# Patient Record
Sex: Female | Born: 1980
Health system: Southern US, Community
[De-identification: ages and names within clinical notes are randomized; demographics above are authoritative.]

## PROBLEM LIST (undated history)

## (undated) DIAGNOSIS — J338 Other polyp of sinus: Secondary | ICD-10-CM

## (undated) DIAGNOSIS — J343 Hypertrophy of nasal turbinates: Secondary | ICD-10-CM

## (undated) DIAGNOSIS — I219 Acute myocardial infarction, unspecified: Secondary | ICD-10-CM

## (undated) DIAGNOSIS — K859 Acute pancreatitis without necrosis or infection, unspecified: Secondary | ICD-10-CM

## (undated) DIAGNOSIS — G43829 Menstrual migraine, not intractable, without status migrainosus: Secondary | ICD-10-CM

## (undated) DIAGNOSIS — I10 Essential (primary) hypertension: Secondary | ICD-10-CM

## (undated) DIAGNOSIS — J309 Allergic rhinitis, unspecified: Secondary | ICD-10-CM

## (undated) DIAGNOSIS — Z8719 Personal history of other diseases of the digestive system: Secondary | ICD-10-CM

## (undated) DIAGNOSIS — M8580 Other specified disorders of bone density and structure, unspecified site: Secondary | ICD-10-CM

## (undated) DIAGNOSIS — F5101 Primary insomnia: Secondary | ICD-10-CM

## (undated) DIAGNOSIS — J101 Influenza due to other identified influenza virus with other respiratory manifestations: Secondary | ICD-10-CM

## (undated) DIAGNOSIS — K219 Gastro-esophageal reflux disease without esophagitis: Secondary | ICD-10-CM

## (undated) DIAGNOSIS — F419 Anxiety disorder, unspecified: Secondary | ICD-10-CM

## (undated) DIAGNOSIS — R112 Nausea with vomiting, unspecified: Secondary | ICD-10-CM

## (undated) DIAGNOSIS — K589 Irritable bowel syndrome without diarrhea: Secondary | ICD-10-CM

## (undated) DIAGNOSIS — K760 Fatty (change of) liver, not elsewhere classified: Secondary | ICD-10-CM

## (undated) DIAGNOSIS — L239 Allergic contact dermatitis, unspecified cause: Secondary | ICD-10-CM

## (undated) DIAGNOSIS — E782 Mixed hyperlipidemia: Secondary | ICD-10-CM

## (undated) DIAGNOSIS — R0600 Dyspnea, unspecified: Secondary | ICD-10-CM

## (undated) DIAGNOSIS — J302 Other seasonal allergic rhinitis: Secondary | ICD-10-CM

## (undated) DIAGNOSIS — L719 Rosacea, unspecified: Secondary | ICD-10-CM

## (undated) DIAGNOSIS — Z9889 Other specified postprocedural states: Secondary | ICD-10-CM

## (undated) DIAGNOSIS — J45909 Unspecified asthma, uncomplicated: Secondary | ICD-10-CM

## (undated) HISTORY — DX: Other specified disorders of bone density and structure, unspecified site: M85.80

## (undated) HISTORY — DX: Allergic contact dermatitis, unspecified cause: L23.9

## (undated) HISTORY — PX: NASAL ENDOSCOPY: SHX286

## (undated) HISTORY — PX: KNEE SURGERY: SHX244

## (undated) HISTORY — DX: Personal history of other diseases of the digestive system: Z87.19

## (undated) HISTORY — PX: TURBINATE RESECTION: SHX293

## (undated) HISTORY — DX: Primary insomnia: F51.01

## (undated) HISTORY — PX: CERVICAL POLYPECTOMY: SHX88

## (undated) HISTORY — DX: Fatty (change of) liver, not elsewhere classified: K76.0

## (undated) HISTORY — DX: Other polyp of sinus: J33.8

## (undated) HISTORY — DX: Menstrual migraine, not intractable, without status migrainosus: G43.829

## (undated) HISTORY — DX: Hypertrophy of nasal turbinates: J34.3

## (undated) HISTORY — DX: Influenza due to other identified influenza virus with other respiratory manifestations: J10.1

## (undated) HISTORY — PX: TONSILLECTOMY AND ADENOIDECTOMY: SHX28

## (undated) HISTORY — PX: KNEE ARTHROSCOPY: SUR90

## (undated) HISTORY — PX: NASAL SINUS SURGERY: SHX719

## (undated) HISTORY — DX: Mixed hyperlipidemia: E78.2

## (undated) HISTORY — DX: Other seasonal allergic rhinitis: J30.2

## (undated) HISTORY — PX: WISDOM TOOTH EXTRACTION: SHX21

## (undated) HISTORY — DX: Rosacea, unspecified: L71.9

## (undated) HISTORY — DX: Allergic rhinitis, unspecified: J30.9

## (undated) HISTORY — DX: Unspecified asthma, uncomplicated: J45.909

## (undated) HISTORY — DX: Acute pancreatitis without necrosis or infection, unspecified: K85.90

---
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
## 2001-02-11 ENCOUNTER — Other Ambulatory Visit: Admission: RE | Admit: 2001-02-11 | Discharge: 2001-02-11 | Payer: Self-pay | Admitting: Obstetrics and Gynecology

## 2002-03-07 ENCOUNTER — Other Ambulatory Visit: Admission: RE | Admit: 2002-03-07 | Discharge: 2002-03-07 | Payer: Self-pay | Admitting: Obstetrics and Gynecology

## 2003-05-04 ENCOUNTER — Other Ambulatory Visit: Admission: RE | Admit: 2003-05-04 | Discharge: 2003-05-04 | Payer: Self-pay | Admitting: Obstetrics and Gynecology

## 2003-09-22 ENCOUNTER — Emergency Department (HOSPITAL_COMMUNITY): Admission: EM | Admit: 2003-09-22 | Discharge: 2003-09-22 | Payer: Self-pay | Admitting: Family Medicine

## 2005-06-04 ENCOUNTER — Emergency Department (HOSPITAL_COMMUNITY): Admission: EM | Admit: 2005-06-04 | Discharge: 2005-06-04 | Payer: Self-pay | Admitting: Emergency Medicine

## 2007-08-18 ENCOUNTER — Ambulatory Visit: Payer: Self-pay | Admitting: Internal Medicine

## 2007-08-18 DIAGNOSIS — R109 Unspecified abdominal pain: Secondary | ICD-10-CM | POA: Insufficient documentation

## 2008-07-18 ENCOUNTER — Ambulatory Visit (HOSPITAL_BASED_OUTPATIENT_CLINIC_OR_DEPARTMENT_OTHER): Admission: RE | Admit: 2008-07-18 | Discharge: 2008-07-18 | Payer: Self-pay | Admitting: Family Medicine

## 2008-07-18 ENCOUNTER — Ambulatory Visit: Payer: Self-pay | Admitting: Interventional Radiology

## 2010-06-08 ENCOUNTER — Emergency Department (HOSPITAL_COMMUNITY)
Admission: EM | Admit: 2010-06-08 | Discharge: 2010-06-09 | Disposition: A | Discharge status: Home / Self Care | Payer: BC Managed Care – PPO | Attending: Emergency Medicine | Admitting: Emergency Medicine

## 2010-06-08 DIAGNOSIS — R197 Diarrhea, unspecified: Secondary | ICD-10-CM | POA: Insufficient documentation

## 2010-06-08 DIAGNOSIS — K5289 Other specified noninfective gastroenteritis and colitis: Secondary | ICD-10-CM | POA: Insufficient documentation

## 2010-06-08 DIAGNOSIS — R112 Nausea with vomiting, unspecified: Secondary | ICD-10-CM | POA: Insufficient documentation

## 2010-06-08 DIAGNOSIS — R42 Dizziness and giddiness: Secondary | ICD-10-CM | POA: Insufficient documentation

## 2010-06-08 LAB — GLUCOSE, CAPILLARY: Glucose-Capillary: 84 mg/dL (ref 70–99)

## 2015-05-23 DIAGNOSIS — J101 Influenza due to other identified influenza virus with other respiratory manifestations: Secondary | ICD-10-CM

## 2015-05-23 HISTORY — DX: Influenza due to other identified influenza virus with other respiratory manifestations: J10.1

## 2015-06-30 DIAGNOSIS — H6123 Impacted cerumen, bilateral: Secondary | ICD-10-CM | POA: Diagnosis not present

## 2015-06-30 DIAGNOSIS — Z6823 Body mass index (BMI) 23.0-23.9, adult: Secondary | ICD-10-CM | POA: Diagnosis not present

## 2015-06-30 DIAGNOSIS — H6982 Other specified disorders of Eustachian tube, left ear: Secondary | ICD-10-CM | POA: Diagnosis not present

## 2015-07-26 ENCOUNTER — Ambulatory Visit (INDEPENDENT_AMBULATORY_CARE_PROVIDER_SITE_OTHER): Payer: Self-pay | Admitting: Pulmonary Disease

## 2015-07-26 ENCOUNTER — Ambulatory Visit (INDEPENDENT_AMBULATORY_CARE_PROVIDER_SITE_OTHER)
Admission: RE | Admit: 2015-07-26 | Discharge: 2015-07-26 | Disposition: A | Discharge status: Home / Self Care | Payer: BLUE CROSS/BLUE SHIELD | Source: Ambulatory Visit | Attending: Pulmonary Disease | Admitting: Pulmonary Disease

## 2015-07-26 ENCOUNTER — Encounter: Payer: Self-pay | Admitting: Pulmonary Disease

## 2015-07-26 ENCOUNTER — Other Ambulatory Visit (INDEPENDENT_AMBULATORY_CARE_PROVIDER_SITE_OTHER): Payer: Self-pay

## 2015-07-26 VITALS — BP 118/80 | HR 95 | Ht 59.0 in | Wt 117.6 lb

## 2015-07-26 DIAGNOSIS — Z91030 Bee allergy status: Secondary | ICD-10-CM

## 2015-07-26 DIAGNOSIS — L239 Allergic contact dermatitis, unspecified cause: Secondary | ICD-10-CM | POA: Insufficient documentation

## 2015-07-26 DIAGNOSIS — R053 Chronic cough: Secondary | ICD-10-CM

## 2015-07-26 DIAGNOSIS — J301 Allergic rhinitis due to pollen: Secondary | ICD-10-CM

## 2015-07-26 DIAGNOSIS — R05 Cough: Secondary | ICD-10-CM

## 2015-07-26 DIAGNOSIS — Z91038 Other insect allergy status: Secondary | ICD-10-CM

## 2015-07-26 DIAGNOSIS — J4540 Moderate persistent asthma, uncomplicated: Secondary | ICD-10-CM

## 2015-07-26 DIAGNOSIS — L200 Besnier's prurigo: Secondary | ICD-10-CM

## 2015-07-26 DIAGNOSIS — J309 Allergic rhinitis, unspecified: Secondary | ICD-10-CM

## 2015-07-26 DIAGNOSIS — J45909 Unspecified asthma, uncomplicated: Secondary | ICD-10-CM | POA: Insufficient documentation

## 2015-07-26 HISTORY — DX: Allergic rhinitis, unspecified: J30.9

## 2015-07-26 HISTORY — DX: Allergic contact dermatitis, unspecified cause: L23.9

## 2015-07-26 LAB — NITRIC OXIDE: NITRIC OXIDE: 42

## 2015-07-26 LAB — CBC WITH DIFFERENTIAL/PLATELET
Basophils Absolute: 0 10*3/uL (ref 0.0–0.1)
Basophils Relative: 0.5 % (ref 0.0–3.0)
Eosinophils Absolute: 1.2 10*3/uL — ABNORMAL HIGH (ref 0.0–0.7)
HEMATOCRIT: 42.9 % (ref 36.0–46.0)
HEMOGLOBIN: 14.8 g/dL (ref 12.0–15.0)
LYMPHS PCT: 33.7 % (ref 12.0–46.0)
Lymphs Abs: 2.7 10*3/uL (ref 0.7–4.0)
MCHC: 34.4 g/dL (ref 30.0–36.0)
MCV: 84.7 fl (ref 78.0–100.0)
MONO ABS: 0.5 10*3/uL (ref 0.1–1.0)
Monocytes Relative: 6.6 % (ref 3.0–12.0)
Neutro Abs: 3.5 10*3/uL (ref 1.4–7.7)
Neutrophils Relative %: 43.6 % (ref 43.0–77.0)
Platelets: 242 10*3/uL (ref 150.0–400.0)
RBC: 5.06 Mil/uL (ref 3.87–5.11)
RDW: 13.2 % (ref 11.5–15.5)
WBC: 7.9 10*3/uL (ref 4.0–10.5)

## 2015-07-26 LAB — COMPREHENSIVE METABOLIC PANEL
ALBUMIN: 4.1 g/dL (ref 3.5–5.2)
ALK PHOS: 69 U/L (ref 39–117)
ALT: 17 U/L (ref 0–35)
AST: 14 U/L (ref 0–37)
BUN: 19 mg/dL (ref 6–23)
CALCIUM: 9.3 mg/dL (ref 8.4–10.5)
CO2: 27 mEq/L (ref 19–32)
CREATININE: 0.99 mg/dL (ref 0.40–1.20)
Chloride: 103 mEq/L (ref 96–112)
GFR: 68.18 mL/min (ref >60.00)
Glucose, Bld: 93 mg/dL (ref 70–99)
POTASSIUM: 4.5 meq/L (ref 3.5–5.1)
SODIUM: 137 meq/L (ref 135–145)
TOTAL PROTEIN: 6.9 g/dL (ref 6.0–8.3)
Total Bilirubin: 0.3 mg/dL (ref 0.2–1.2)

## 2015-07-26 MED ORDER — MONTELUKAST SODIUM 10 MG PO TABS: 10.0000 mg | ORAL_TABLET | Freq: Every day | ORAL | Status: DC

## 2015-07-26 NOTE — Progress Notes (Signed)
   Subjective:    Patient ID: Jordan Lara, female    DOB: [birth year], 35 y.o.   MRN: [id number]  HPI    Review of Systems  Constitutional: Negative for fever and unexpected weight change.  HENT: Positive for congestion, sneezing and trouble swallowing. Negative for dental problem, ear pain, nosebleeds, postnasal drip, rhinorrhea, sinus pressure and sore throat.   Eyes: Negative for redness and itching.  Respiratory: Positive for cough and shortness of breath. Negative for chest tightness and wheezing.   Cardiovascular: Negative for palpitations and leg swelling.  Gastrointestinal: Positive for abdominal pain. Negative for nausea and vomiting.       Acid heartburn // indigestion  Genitourinary: Negative for dysuria.  Musculoskeletal: Negative for joint swelling.  Skin: Negative for rash ( itching).  Neurological: Negative for headaches.  Hematological: Does not bruise/bleed easily.  Psychiatric/Behavioral: Negative for dysphoric mood. The patient is not nervous/anxious.        Objective:   Physical Exam        Assessment & Plan:

## 2015-07-26 NOTE — Patient Instructions (Signed)
Chest xray and lab tests today  Will schedule pulmonary function test  Montelukast (singulair) 10 mg pill nightly before bedtime  Follow up in 6 weeks with Dr. Halford Chessman or Nurse Practitioner

## 2015-07-26 NOTE — Progress Notes (Signed)
Past surgical history She  has past surgical history that includes Knee surgery (10+ years ago).  Family history Her family history is not on file.  Social history She  reports that she has never smoked. She does not have any smokeless tobacco history on file. She reports that she does not use illicit drugs.  Allergies  Allergen Reactions  . Augmentin [Amoxicillin-Pot Clavulanate]   . Azithromycin   . Sulfonamide Derivatives    Review of Systems  Constitutional: Negative for fever and unexpected weight change.  HENT: Positive for congestion, sneezing and trouble swallowing. Negative for dental problem, ear pain, nosebleeds, postnasal drip, rhinorrhea, sinus pressure and sore throat.   Eyes: Negative for redness and itching.  Respiratory: Positive for cough and shortness of breath. Negative for chest tightness and wheezing.   Cardiovascular: Negative for palpitations and leg swelling.  Gastrointestinal: Positive for abdominal pain. Negative for nausea and vomiting.       Acid heartburn // indigestion  Genitourinary: Negative for dysuria.  Musculoskeletal: Negative for joint swelling.  Skin: Negative for rash ( itching).  Neurological: Negative for headaches.  Hematological: Does not bruise/bleed easily.  Psychiatric/Behavioral: Negative for dysphoric mood. The patient is not nervous/anxious.    No current outpatient prescriptions on file prior to visit.   No current facility-administered medications on file prior to visit.    Chief Complaint  Patient presents with  . PULMONARY CONSULT    Self Referral - asthma.     Tests Nitric oxide 05/26/15 >> 42   Past medical history She  has a past medical history of Asthma; Seasonal allergies; and Influenza A (March 2017).  Vital signs BP 118/80 mmHg  Pulse 95  Ht 4\' 11"  (1.499 m)  Wt 117 lb 9.6 oz (53.343 kg)  BMI 23.74 kg/m2  SpO2 98%  History of present illness Jordan Lara is a 35 y.o. female with asthma.  She had  trouble with allergies since childhood.  She is from [place], and has lived in [place] area her whole life.  She had allergy shots as a child, and was she had allergies to dust mites, grasses, and cats.  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  She had trouble with allergies to food dyes.  She also had really bad hand swelling after bee sting as a teenager >> she has an Epi pen.  She reports allergies to augmentin, zithromax, and sulfa antibiotics.  She is not aware of any food allergies.  She does get eczema and uses kenalog cream intermittently.  Her asthma symptoms only would happen with exercise when she was a teenager, and she would only need to use albuterol sporadically.  Over the past 5 years her asthma has become more troublesome.  She has needed 2 course of steroids (by pill and injection) over the past 6 months.  She gets coughing and wheezing spells, and these seem to happen more at night.  She does not usually bring up sputum.  She was previously on advair, but this caused terrible palpitations.  She has more recently been on arnuity and this helps some.  She thinks she was tried on singulair as a teenager, but not since.  She doesn't remember ever having breathing tests.  She has not had recent chest xray or lab work.  She had episode recently of terrible reflux, and then she developed wheezing.  She has been using prilosec, and this has helped.  She hasn't had reflux  prior to this recent episode.   Physical exam  General - No distress ENT - No sinus tenderness, no oral exudate, no LAN, no thyromegaly, TM clear, pupils equal/reactive, pale nasal mucosa Cardiac - s1s2 regular, no murmur, pulses symmetric Chest - No wheeze/rales/dullness, good air entry, normal respiratory excursion Back - No focal tenderness Abd - Soft, non-tender, no organomegaly, + bowel sounds Ext - No edema Neuro - Normal strength, cranial nerves intact Skin - No rashes Psych - Normal  mood, and behavior   Discussion She has symptoms and history consistent with allergic asthma.  She has persistent symptoms in spite of being on several medications.  Her nixtric oxide level was elevated today.   Assessment/plan  Allergic asthma. - will get labs including CBC with diff, CMET, RAST, and IgE - will get chest xray today - schedule pulmonary function testing - repeat nitric oxide on follow up visit - continue arnuity >> defer adding LABA for now given prior hx of palpitations with advair - add singulair 10 mg qhs - continue prn albuterol - depending on her response and lab results, will determine if she needs further assessment for allergies and possible consideration for biologic agent such as xolair  Allergic rhinitis. - continue zyrtec, benadryl, flonase as needed - singulair should help with this also  Recent episode of reflux. - prilosec prn - explained that H2 blockers could be helpful in her situation if her reflux is triggered in conjunction with worsening allergy symptoms  Eczema. - kenalog cream prn  Adverse reaction to bee sting. - she has Epi-Pen   Patient Instructions  Chest xray and lab tests today  Will schedule pulmonary function test  Montelukast (singulair) 10 mg pill nightly before bedtime  Follow up in 6 weeks with Dr. Halford Chessman or Nurse Practitioner     Chesley Mires, MD [place] Pager:  [phone] 07/26/2015, 10:08 AM

## 2015-07-27 ENCOUNTER — Telehealth: Payer: Self-pay | Admitting: Pulmonary Disease

## 2015-07-27 LAB — IGE: IgE (Immunoglobulin E), Serum: 76 kU/L (ref <115)

## 2015-07-27 NOTE — Telephone Encounter (Signed)
LM x 1 for pt 

## 2015-07-27 NOTE — Telephone Encounter (Signed)
Dg Chest 2 View  07/26/2015  CLINICAL DATA:  Chronic cough EXAM: CHEST  2 VIEW COMPARISON:  July 18, 2008 FINDINGS: Lungs are clear. Heart size and pulmonary vascularity are normal. No adenopathy. No bone lesions. IMPRESSION: No edema or consolidation. Electronically Signed   By: Lowella Grip III M.D.   On: 07/26/2015 12:09    Will have my nurse inform pt that CXR Was normal.  Will call back once all of blood test results are available.

## 2015-07-29 LAB — ALLERGEN PROFILE, PERENNIAL ALLERGEN IGE
Aureobasidi Pullulans IgE: <0.1 kU/L
Candida Albicans IgE: <0.1 kU/L
Cat Dander IgE: 0.16 kU/L — AB
Chicken Feathers IgE: <0.1 kU/L
D Farinae IgE: <0.1 kU/L
D Pteronyssinus IgE: <0.1 kU/L
Duck Feathers IgE: <0.1 kU/L
E005-IGE DOG DANDER: 0.55 kU/L — AB
Goose Feathers IgE: <0.1 kU/L
Mouse Urine IgE: <0.1 kU/L
Mucor Racemosus IgE: <0.1 kU/L
Penicillium Chrysogen IgE: <0.1 kU/L
Phoma Betae IgE: <0.1 kU/L

## 2015-07-30 DIAGNOSIS — Z0000 Encounter for general adult medical examination without abnormal findings: Secondary | ICD-10-CM | POA: Diagnosis not present

## 2015-07-30 DIAGNOSIS — Z6823 Body mass index (BMI) 23.0-23.9, adult: Secondary | ICD-10-CM | POA: Diagnosis not present

## 2015-07-30 DIAGNOSIS — J4530 Mild persistent asthma, uncomplicated: Secondary | ICD-10-CM | POA: Diagnosis not present

## 2015-07-30 DIAGNOSIS — K219 Gastro-esophageal reflux disease without esophagitis: Secondary | ICD-10-CM | POA: Diagnosis not present

## 2015-07-30 NOTE — Telephone Encounter (Signed)
[phone], pt Jordan Lara

## 2015-07-30 NOTE — Telephone Encounter (Signed)
Pt is aware of results. Nothing further was needed. 

## 2015-10-04 ENCOUNTER — Ambulatory Visit: Payer: Self-pay | Admitting: Adult Health

## 2015-10-09 DIAGNOSIS — Z6823 Body mass index (BMI) 23.0-23.9, adult: Secondary | ICD-10-CM | POA: Diagnosis not present

## 2015-10-09 DIAGNOSIS — R87610 Atypical squamous cells of undetermined significance on cytologic smear of cervix (ASC-US): Secondary | ICD-10-CM | POA: Diagnosis not present

## 2015-10-09 DIAGNOSIS — Z01419 Encounter for gynecological examination (general) (routine) without abnormal findings: Secondary | ICD-10-CM | POA: Diagnosis not present

## 2015-10-09 DIAGNOSIS — Z1151 Encounter for screening for human papillomavirus (HPV): Secondary | ICD-10-CM | POA: Diagnosis not present

## 2015-11-14 DIAGNOSIS — L508 Other urticaria: Secondary | ICD-10-CM | POA: Diagnosis not present

## 2015-11-14 DIAGNOSIS — J3089 Other allergic rhinitis: Secondary | ICD-10-CM | POA: Diagnosis not present

## 2015-11-14 DIAGNOSIS — J309 Allergic rhinitis, unspecified: Secondary | ICD-10-CM | POA: Diagnosis not present

## 2015-11-14 DIAGNOSIS — J3081 Allergic rhinitis due to animal (cat) (dog) hair and dander: Secondary | ICD-10-CM | POA: Diagnosis not present

## 2015-11-14 DIAGNOSIS — T39015A Adverse effect of aspirin, initial encounter: Secondary | ICD-10-CM | POA: Diagnosis not present

## 2015-11-14 DIAGNOSIS — J4540 Moderate persistent asthma, uncomplicated: Secondary | ICD-10-CM | POA: Diagnosis not present

## 2015-11-14 DIAGNOSIS — Z888 Allergy status to other drugs, medicaments and biological substances status: Secondary | ICD-10-CM | POA: Diagnosis not present

## 2015-11-15 DIAGNOSIS — A09 Infectious gastroenteritis and colitis, unspecified: Secondary | ICD-10-CM | POA: Diagnosis not present

## 2015-11-15 DIAGNOSIS — Z6824 Body mass index (BMI) 24.0-24.9, adult: Secondary | ICD-10-CM | POA: Diagnosis not present

## 2015-11-23 ENCOUNTER — Ambulatory Visit: Payer: Self-pay | Admitting: Adult Health

## 2015-12-12 DIAGNOSIS — K591 Functional diarrhea: Secondary | ICD-10-CM | POA: Diagnosis not present

## 2015-12-12 DIAGNOSIS — Z6824 Body mass index (BMI) 24.0-24.9, adult: Secondary | ICD-10-CM | POA: Diagnosis not present

## 2016-01-09 ENCOUNTER — Encounter: Payer: Self-pay | Admitting: Internal Medicine

## 2016-03-18 ENCOUNTER — Ambulatory Visit (INDEPENDENT_AMBULATORY_CARE_PROVIDER_SITE_OTHER): Payer: BLUE CROSS/BLUE SHIELD | Admitting: Internal Medicine

## 2016-03-18 ENCOUNTER — Encounter: Payer: Self-pay | Admitting: Internal Medicine

## 2016-03-18 ENCOUNTER — Encounter (INDEPENDENT_AMBULATORY_CARE_PROVIDER_SITE_OTHER): Payer: Self-pay

## 2016-03-18 VITALS — BP 96/60 | HR 86 | Ht <= 58 in | Wt 108.0 lb

## 2016-03-18 DIAGNOSIS — K580 Irritable bowel syndrome with diarrhea: Secondary | ICD-10-CM | POA: Diagnosis not present

## 2016-03-18 NOTE — Patient Instructions (Addendum)
  Please try and cut back on your fiber intake.  Handout provided.  We are giving you a handout to read on post infectious IBS.   I appreciate the opportunity to care for you. Silvano Rusk, MD, [facility]

## 2016-03-18 NOTE — Progress Notes (Signed)
Jordan Lara 35 y.o. [birth year] [id number] Referred by: [name], MD  Assessment & Plan:   Encounter Diagnosis  Name Primary?  . 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 We reviewed this today. She's had an appropriate workup, with stool studies are negative as reflected below, she's also had labs throughout the year with normal CBC TSH chemistries in May. I don't think we need to repeat any of that she is improving. She may be eating too much fiber that may be making it hard to come back to closer to her normal bowel pattern. She may never reach that and I explained. Plans are: Reduce fiber Post infectious IBS handout provided Observe - see me again prn  Her father's history of malignancy of the rectum really does not impact her in that it occurred in the setting of chronic Crohn's disease thus I do not think she needs an earlier than normal colonoscopy.  It is noted that her dog has suffered with diarrhea and its possible to pickup worms etc. that might be causing some problems but that seems unlikely in her though it possibly could be in her husband. We'll filed this for future reference if needed. She is comfortable with the plan as above.  I appreciate the opportunity to care for this patient. CC: [name],[name], MD     Subjective:   Chief Complaint: Diarrhea  HPI Patient is a very nice young married white woman, the daughter of my patient Jordan  Lara with diarrhea problems since August 13 when she started with 10 watery stools or more a day, acutely associated with some nausea and vomiting. She saw her primary care provider Dr. [name], and he appropriately thought this was infectious gastroenteritis she ended up having stool studies for C. difficile toxin culture  and ova and parasite screens for Giardia and cryptosporidium which were negative. Over time she has improved and has anywhere from 0-3 loose sometimes watery or soft stools a day but is not back to the way she was prior to the acute onset. She has lost some weight, perhaps 10 pounds may be more but she has changed her diet she's actually been exercising and she thinks she has leveled off over time with respect to that. She has not had bleeding. There is some mild nausea and abdominal pain and bloating. She is eating fermented foods and sardines and smoothie and a lot of vegetables and fruits to try to eat healthy. Sometime after she developed her problems her husband started having several watery stools a day and he is worse off and she is now but he was never acutely seriously ill etc. in any pattern like she was. She had been on a PPI which she actually stopped in August because she had an asthma flare or several flares and worked with Dr. Halford Chessman, and is not really taking all the medications she was taking, she had a few rounds of steroids as well though short-lived. She's never had problems like this before. Her father does have a history of Crohn's disease and actually developed rectal cancer in the setting of chronic inflammation for which she was successfully treated. The other historical feature is that her dog as had some intermittent diarrhea treated with diet changes etc. No clear pathogen noted.  Overall she feels improved but again not back to the way she was prior. Not having any other GI symptoms at this time. She prefers not to take medications.  Allergies  Allergen Reactions  . Aspirin   . Augmentin [Amoxicillin-Pot Clavulanate]   . Azithromycin   . Sulfonamide Derivatives    Outpatient Medications Prior to Visit  Medication Sig Dispense Refill  . albuterol (PROVENTIL HFA;VENTOLIN HFA) 108 (90 Base) MCG/ACT inhaler Inhale 1-2 puffs into the lungs every 6 (six) hours as needed for wheezing  or shortness of breath.    . diphenhydrAMINE (BENADRYL) 25 MG tablet Take 25 mg by mouth every 6 (six) hours as needed.    Marland Kitchen EPINEPHrine (EPIPEN IJ) Inject as directed as needed.    . fluticasone (FLONASE) 50 MCG/ACT nasal spray Place 2 sprays into both nostrils daily.    . Fluticasone Furoate (ARNUITY ELLIPTA) 100 MCG/ACT AEPB Inhale 1 puff into the lungs daily.    . montelukast (SINGULAIR) 10 MG tablet Take 1 tablet (10 mg total) by mouth at bedtime. 30 tablet 5  . oxymetazoline (AFRIN) 0.05 % nasal spray Place 1 spray into both nostrils as needed for congestion.    . triamcinolone cream (KENALOG) 0.1 % Apply 1 application topically as needed.    Marland Kitchen aspirin 325 MG tablet Take 325 mg by mouth daily.    . cetirizine (ZYRTEC) 10 MG tablet Take 10 mg by mouth daily.    . Doxylamine-DM (VICKS NYQUIL COUGH PO) Take by mouth as needed.    Marland Kitchen omeprazole (PRILOSEC) 20 MG capsule Take 20 mg by mouth daily.     No facility-administered medications prior to visit.    Past Medical History:  Diagnosis Date  . Allergic rhinitis 07/26/2015  . Asthma    exercise induced asthma - dx as a child  . Eczema, allergic 07/26/2015  . Influenza A March 2017  . Seasonal allergies    Past Surgical History:  Procedure Laterality Date  . KNEE SURGERY  10+ years ago   Social History   Social History  . Marital status: Married    Spouse name: N/A  . Number of children: N/A  . Years of education: N/A   Occupational History  . 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 Smoking status: Never Smoker  . Smokeless tobacco: None  . Alcohol use 0.0 oz/week     Comment: 1 weekly average  . Drug use: No   Social History Narrative   Married no children. 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 1 green tea daily. One alcoholic beverage daily.   03/18/2016      Family History  Problem Relation Age of Onset  . Hyperlipidemia Mother   . Crohn's disease  Father   . Colon cancer Father   . Stroke Maternal Grandmother     Review of Systems As per history of present illness. No significant stressors or anxiety. She has some allergies asthma itching problems at times. All that is better at this time. Otherwise review of systems are negative.  Objective:   Physical Exam @BP  96/60   Pulse 86   Ht 4\' 10"  (1.473 m)   Wt 108 lb (49 kg)   LMP 03/13/2016   BMI 22.57 kg/m @  General:  Well-developed, well-nourished and in no acute distress Eyes:  anicteric. ENT:   Mouth and posterior pharynx free of lesions.  Neck:  supple w/o thyromegaly or mass.  Lungs: Clear to auscultation bilaterally. Heart:  S1S2, no rubs, murmurs, gallops. Abdomen:  soft, non-tender, no hepatosplenomegaly, hernia, or mass and BS+.  Lymph:  no cervical or supraclavicular adenopathy. Extremities:   no edema, cyanosis or clubbing Skin   no rash. + tattoo low back Neuro:  A&O x 3.  Psych:  appropriate mood and  Affect.   Data Reviewed:  See history of present illness. PCP notes from 2017 are reviewed labs 2017 are reviewed, pulmonary note reviewed.

## 2016-03-24 DIAGNOSIS — I201 Angina pectoris with documented spasm: Secondary | ICD-10-CM

## 2016-03-24 HISTORY — DX: Angina pectoris with documented spasm: I20.1

## 2016-05-02 ENCOUNTER — Other Ambulatory Visit: Payer: Self-pay | Admitting: Pulmonary Disease

## 2016-05-22 DIAGNOSIS — I219 Acute myocardial infarction, unspecified: Secondary | ICD-10-CM

## 2016-05-22 HISTORY — DX: Acute myocardial infarction, unspecified: I21.9

## 2016-06-24 ENCOUNTER — Telehealth: Payer: Self-pay | Admitting: Internal Medicine

## 2016-06-24 NOTE — Telephone Encounter (Signed)
Left message for patient to call back  

## 2016-06-24 NOTE — Telephone Encounter (Signed)
Patient reports that she is having nocturnal chest pain that radiates into her neck, arms, jaw, and back. She is seeing her primary care in the am and for now will keep the follow up with Nicoletta Ba PA for next week.

## 2016-06-25 DIAGNOSIS — I201 Angina pectoris with documented spasm: Secondary | ICD-10-CM | POA: Diagnosis not present

## 2016-06-25 DIAGNOSIS — R072 Precordial pain: Secondary | ICD-10-CM | POA: Diagnosis not present

## 2016-06-25 DIAGNOSIS — I501 Left ventricular failure: Secondary | ICD-10-CM | POA: Diagnosis not present

## 2016-06-25 DIAGNOSIS — R0789 Other chest pain: Secondary | ICD-10-CM | POA: Diagnosis not present

## 2016-06-25 DIAGNOSIS — I214 Non-ST elevation (NSTEMI) myocardial infarction: Secondary | ICD-10-CM | POA: Diagnosis not present

## 2016-06-25 DIAGNOSIS — I25118 Atherosclerotic heart disease of native coronary artery with other forms of angina pectoris: Secondary | ICD-10-CM | POA: Diagnosis not present

## 2016-06-25 DIAGNOSIS — R0602 Shortness of breath: Secondary | ICD-10-CM | POA: Diagnosis not present

## 2016-06-25 DIAGNOSIS — R9431 Abnormal electrocardiogram [ECG] [EKG]: Secondary | ICD-10-CM | POA: Diagnosis not present

## 2016-06-25 DIAGNOSIS — Z6822 Body mass index (BMI) 22.0-22.9, adult: Secondary | ICD-10-CM | POA: Diagnosis not present

## 2016-06-25 DIAGNOSIS — I2111 ST elevation (STEMI) myocardial infarction involving right coronary artery: Secondary | ICD-10-CM | POA: Diagnosis not present

## 2016-06-25 DIAGNOSIS — Z6821 Body mass index (BMI) 21.0-21.9, adult: Secondary | ICD-10-CM | POA: Diagnosis not present

## 2016-06-25 DIAGNOSIS — J45909 Unspecified asthma, uncomplicated: Secondary | ICD-10-CM | POA: Diagnosis not present

## 2016-06-25 DIAGNOSIS — R079 Chest pain, unspecified: Secondary | ICD-10-CM | POA: Diagnosis not present

## 2016-06-25 HISTORY — PX: OTHER SURGICAL HISTORY: SHX169

## 2016-06-26 DIAGNOSIS — R9431 Abnormal electrocardiogram [ECG] [EKG]: Secondary | ICD-10-CM | POA: Diagnosis not present

## 2016-06-26 DIAGNOSIS — J45909 Unspecified asthma, uncomplicated: Secondary | ICD-10-CM | POA: Diagnosis not present

## 2016-06-26 DIAGNOSIS — R072 Precordial pain: Secondary | ICD-10-CM | POA: Diagnosis not present

## 2016-06-26 DIAGNOSIS — Z6821 Body mass index (BMI) 21.0-21.9, adult: Secondary | ICD-10-CM | POA: Diagnosis not present

## 2016-06-26 DIAGNOSIS — I201 Angina pectoris with documented spasm: Secondary | ICD-10-CM | POA: Diagnosis not present

## 2016-06-30 DIAGNOSIS — I201 Angina pectoris with documented spasm: Secondary | ICD-10-CM | POA: Diagnosis not present

## 2016-06-30 DIAGNOSIS — J4530 Mild persistent asthma, uncomplicated: Secondary | ICD-10-CM | POA: Diagnosis not present

## 2016-06-30 DIAGNOSIS — R9431 Abnormal electrocardiogram [ECG] [EKG]: Secondary | ICD-10-CM | POA: Diagnosis not present

## 2016-06-30 DIAGNOSIS — F4322 Adjustment disorder with anxiety: Secondary | ICD-10-CM | POA: Diagnosis not present

## 2016-07-01 ENCOUNTER — Ambulatory Visit: Payer: BLUE CROSS/BLUE SHIELD | Admitting: Physician Assistant

## 2016-07-01 DIAGNOSIS — J4530 Mild persistent asthma, uncomplicated: Secondary | ICD-10-CM | POA: Diagnosis not present

## 2016-07-01 DIAGNOSIS — R079 Chest pain, unspecified: Secondary | ICD-10-CM | POA: Diagnosis not present

## 2016-07-01 DIAGNOSIS — I201 Angina pectoris with documented spasm: Secondary | ICD-10-CM | POA: Diagnosis not present

## 2016-07-01 DIAGNOSIS — F419 Anxiety disorder, unspecified: Secondary | ICD-10-CM | POA: Diagnosis not present

## 2016-07-15 DIAGNOSIS — L719 Rosacea, unspecified: Secondary | ICD-10-CM | POA: Diagnosis not present

## 2016-07-15 DIAGNOSIS — Z6821 Body mass index (BMI) 21.0-21.9, adult: Secondary | ICD-10-CM | POA: Diagnosis not present

## 2016-07-22 DIAGNOSIS — J3089 Other allergic rhinitis: Secondary | ICD-10-CM | POA: Diagnosis not present

## 2016-07-22 DIAGNOSIS — J4530 Mild persistent asthma, uncomplicated: Secondary | ICD-10-CM | POA: Diagnosis not present

## 2016-07-22 DIAGNOSIS — L239 Allergic contact dermatitis, unspecified cause: Secondary | ICD-10-CM | POA: Diagnosis not present

## 2016-07-22 DIAGNOSIS — I201 Angina pectoris with documented spasm: Secondary | ICD-10-CM | POA: Diagnosis not present

## 2016-07-22 DIAGNOSIS — K219 Gastro-esophageal reflux disease without esophagitis: Secondary | ICD-10-CM | POA: Diagnosis not present

## 2016-08-04 DIAGNOSIS — J3089 Other allergic rhinitis: Secondary | ICD-10-CM | POA: Diagnosis not present

## 2016-08-04 DIAGNOSIS — Z6821 Body mass index (BMI) 21.0-21.9, adult: Secondary | ICD-10-CM | POA: Diagnosis not present

## 2016-08-04 DIAGNOSIS — F5101 Primary insomnia: Secondary | ICD-10-CM | POA: Diagnosis not present

## 2016-09-15 DIAGNOSIS — J4540 Moderate persistent asthma, uncomplicated: Secondary | ICD-10-CM | POA: Diagnosis not present

## 2016-09-15 DIAGNOSIS — J3089 Other allergic rhinitis: Secondary | ICD-10-CM | POA: Diagnosis not present

## 2016-10-07 DIAGNOSIS — J45909 Unspecified asthma, uncomplicated: Secondary | ICD-10-CM | POA: Diagnosis not present

## 2016-10-07 DIAGNOSIS — Z6821 Body mass index (BMI) 21.0-21.9, adult: Secondary | ICD-10-CM | POA: Diagnosis not present

## 2016-10-07 DIAGNOSIS — I201 Angina pectoris with documented spasm: Secondary | ICD-10-CM | POA: Diagnosis not present

## 2016-10-17 DIAGNOSIS — J3089 Other allergic rhinitis: Secondary | ICD-10-CM | POA: Diagnosis not present

## 2016-10-17 DIAGNOSIS — J339 Nasal polyp, unspecified: Secondary | ICD-10-CM | POA: Diagnosis not present

## 2016-10-29 DIAGNOSIS — Z1151 Encounter for screening for human papillomavirus (HPV): Secondary | ICD-10-CM | POA: Diagnosis not present

## 2016-10-29 DIAGNOSIS — R87610 Atypical squamous cells of undetermined significance on cytologic smear of cervix (ASC-US): Secondary | ICD-10-CM | POA: Diagnosis not present

## 2016-10-29 DIAGNOSIS — Z6821 Body mass index (BMI) 21.0-21.9, adult: Secondary | ICD-10-CM | POA: Diagnosis not present

## 2016-10-29 DIAGNOSIS — Z01419 Encounter for gynecological examination (general) (routine) without abnormal findings: Secondary | ICD-10-CM | POA: Diagnosis not present

## 2016-11-07 DIAGNOSIS — K219 Gastro-esophageal reflux disease without esophagitis: Secondary | ICD-10-CM | POA: Diagnosis not present

## 2016-11-07 DIAGNOSIS — J4540 Moderate persistent asthma, uncomplicated: Secondary | ICD-10-CM | POA: Diagnosis not present

## 2016-11-07 DIAGNOSIS — J4530 Mild persistent asthma, uncomplicated: Secondary | ICD-10-CM | POA: Diagnosis not present

## 2016-11-07 DIAGNOSIS — J3089 Other allergic rhinitis: Secondary | ICD-10-CM | POA: Diagnosis not present

## 2016-11-19 DIAGNOSIS — J339 Nasal polyp, unspecified: Secondary | ICD-10-CM | POA: Diagnosis not present

## 2016-11-26 DIAGNOSIS — R87610 Atypical squamous cells of undetermined significance on cytologic smear of cervix (ASC-US): Secondary | ICD-10-CM | POA: Diagnosis not present

## 2016-11-28 DIAGNOSIS — J339 Nasal polyp, unspecified: Secondary | ICD-10-CM | POA: Diagnosis not present

## 2016-12-19 DIAGNOSIS — J324 Chronic pansinusitis: Secondary | ICD-10-CM | POA: Diagnosis not present

## 2016-12-29 DIAGNOSIS — Z01810 Encounter for preprocedural cardiovascular examination: Secondary | ICD-10-CM | POA: Diagnosis not present

## 2016-12-29 DIAGNOSIS — J339 Nasal polyp, unspecified: Secondary | ICD-10-CM | POA: Diagnosis not present

## 2016-12-29 DIAGNOSIS — Z01812 Encounter for preprocedural laboratory examination: Secondary | ICD-10-CM | POA: Diagnosis not present

## 2016-12-30 DIAGNOSIS — J4530 Mild persistent asthma, uncomplicated: Secondary | ICD-10-CM | POA: Diagnosis not present

## 2016-12-30 DIAGNOSIS — J0191 Acute recurrent sinusitis, unspecified: Secondary | ICD-10-CM | POA: Diagnosis not present

## 2016-12-30 DIAGNOSIS — T7849XA Other allergy, initial encounter: Secondary | ICD-10-CM | POA: Diagnosis not present

## 2016-12-30 DIAGNOSIS — J330 Polyp of nasal cavity: Secondary | ICD-10-CM | POA: Diagnosis not present

## 2016-12-30 DIAGNOSIS — J3089 Other allergic rhinitis: Secondary | ICD-10-CM | POA: Diagnosis not present

## 2016-12-30 DIAGNOSIS — J0101 Acute recurrent maxillary sinusitis: Secondary | ICD-10-CM | POA: Diagnosis not present

## 2016-12-30 DIAGNOSIS — J0121 Acute recurrent ethmoidal sinusitis: Secondary | ICD-10-CM | POA: Diagnosis not present

## 2016-12-30 DIAGNOSIS — X58XXXA Exposure to other specified factors, initial encounter: Secondary | ICD-10-CM | POA: Diagnosis not present

## 2016-12-30 DIAGNOSIS — J339 Nasal polyp, unspecified: Secondary | ICD-10-CM | POA: Diagnosis not present

## 2016-12-30 DIAGNOSIS — J329 Chronic sinusitis, unspecified: Secondary | ICD-10-CM | POA: Diagnosis not present

## 2017-02-11 DIAGNOSIS — J339 Nasal polyp, unspecified: Secondary | ICD-10-CM | POA: Diagnosis not present

## 2017-02-24 DIAGNOSIS — J339 Nasal polyp, unspecified: Secondary | ICD-10-CM | POA: Diagnosis not present

## 2017-03-06 ENCOUNTER — Other Ambulatory Visit: Payer: Self-pay | Admitting: Obstetrics and Gynecology

## 2017-03-06 DIAGNOSIS — N6320 Unspecified lump in the left breast, unspecified quadrant: Secondary | ICD-10-CM | POA: Diagnosis not present

## 2017-03-06 DIAGNOSIS — D252 Subserosal leiomyoma of uterus: Secondary | ICD-10-CM | POA: Diagnosis not present

## 2017-03-06 DIAGNOSIS — N920 Excessive and frequent menstruation with regular cycle: Secondary | ICD-10-CM | POA: Diagnosis not present

## 2017-03-06 DIAGNOSIS — D251 Intramural leiomyoma of uterus: Secondary | ICD-10-CM | POA: Diagnosis not present

## 2017-03-07 ENCOUNTER — Other Ambulatory Visit: Payer: Self-pay | Admitting: Obstetrics and Gynecology

## 2017-03-07 DIAGNOSIS — R5381 Other malaise: Secondary | ICD-10-CM

## 2017-03-31 DIAGNOSIS — N6321 Unspecified lump in the left breast, upper outer quadrant: Secondary | ICD-10-CM | POA: Diagnosis not present

## 2017-03-31 DIAGNOSIS — J339 Nasal polyp, unspecified: Secondary | ICD-10-CM | POA: Diagnosis not present

## 2017-04-02 DIAGNOSIS — N920 Excessive and frequent menstruation with regular cycle: Secondary | ICD-10-CM | POA: Diagnosis not present

## 2017-04-13 DIAGNOSIS — J342 Deviated nasal septum: Secondary | ICD-10-CM | POA: Diagnosis not present

## 2017-04-13 DIAGNOSIS — J3089 Other allergic rhinitis: Secondary | ICD-10-CM | POA: Diagnosis not present

## 2017-04-13 DIAGNOSIS — J339 Nasal polyp, unspecified: Secondary | ICD-10-CM | POA: Diagnosis not present

## 2017-04-13 DIAGNOSIS — J343 Hypertrophy of nasal turbinates: Secondary | ICD-10-CM | POA: Diagnosis not present

## 2017-04-14 DIAGNOSIS — N920 Excessive and frequent menstruation with regular cycle: Secondary | ICD-10-CM | POA: Diagnosis not present

## 2017-04-14 DIAGNOSIS — N9489 Other specified conditions associated with female genital organs and menstrual cycle: Secondary | ICD-10-CM | POA: Diagnosis not present

## 2017-04-23 DIAGNOSIS — N939 Abnormal uterine and vaginal bleeding, unspecified: Secondary | ICD-10-CM | POA: Diagnosis not present

## 2017-05-04 ENCOUNTER — Other Ambulatory Visit: Payer: Self-pay | Admitting: Obstetrics and Gynecology

## 2017-05-05 NOTE — Patient Instructions (Addendum)
Your procedure is scheduled on:  Friday, Feb 15  Enter through the Main Entrance of [facility] at: 9:50 am  Pick up the phone at the desk and dial [phone].  Call this number if you have problems the morning of surgery: [phone].  Remember: Do NOT eat or Do NOT drink clear liquids (including water) after midnight Thursday  Take these medicines the morning of surgery with a SIP OF WATER: amlodipine, protonix and valium if needed.  Ok to use flonase nasal spray.  Bring albuterol inhaler with you on day of surgery.  Stop herbal medications and supplements at this time.  Do NOT wear jewelry (body piercing), metal hair clips/bobby pins, make-up, or nail polish. Do NOT wear lotions, powders, or perfumes.  You may wear deoderant. Do NOT shave for 48 hours prior to surgery. Do NOT bring valuables to the hospital.  Have a responsible adult drive you home and stay with you for 24 hours after your procedure.  Home with Husband Jordan Lara cell [phone].

## 2017-05-06 ENCOUNTER — Encounter (HOSPITAL_COMMUNITY): Payer: Self-pay | Admitting: *Deleted

## 2017-05-06 ENCOUNTER — Encounter (HOSPITAL_COMMUNITY)
Admission: RE | Admit: 2017-05-06 | Discharge: 2017-05-06 | Disposition: A | Discharge status: Home / Self Care | Payer: BLUE CROSS/BLUE SHIELD | Source: Ambulatory Visit | Attending: Obstetrics and Gynecology | Admitting: Obstetrics and Gynecology

## 2017-05-06 ENCOUNTER — Other Ambulatory Visit: Payer: Self-pay

## 2017-05-06 DIAGNOSIS — Z880 Allergy status to penicillin: Secondary | ICD-10-CM | POA: Diagnosis not present

## 2017-05-06 DIAGNOSIS — J45990 Exercise induced bronchospasm: Secondary | ICD-10-CM | POA: Diagnosis not present

## 2017-05-06 DIAGNOSIS — Z79899 Other long term (current) drug therapy: Secondary | ICD-10-CM | POA: Diagnosis not present

## 2017-05-06 DIAGNOSIS — N921 Excessive and frequent menstruation with irregular cycle: Secondary | ICD-10-CM | POA: Diagnosis not present

## 2017-05-06 DIAGNOSIS — Z882 Allergy status to sulfonamides status: Secondary | ICD-10-CM | POA: Diagnosis not present

## 2017-05-06 DIAGNOSIS — K219 Gastro-esophageal reflux disease without esophagitis: Secondary | ICD-10-CM | POA: Diagnosis not present

## 2017-05-06 DIAGNOSIS — F419 Anxiety disorder, unspecified: Secondary | ICD-10-CM | POA: Diagnosis not present

## 2017-05-06 DIAGNOSIS — Z886 Allergy status to analgesic agent status: Secondary | ICD-10-CM | POA: Diagnosis not present

## 2017-05-06 DIAGNOSIS — Z881 Allergy status to other antibiotic agents status: Secondary | ICD-10-CM | POA: Diagnosis not present

## 2017-05-06 DIAGNOSIS — I252 Old myocardial infarction: Secondary | ICD-10-CM | POA: Diagnosis not present

## 2017-05-06 DIAGNOSIS — N840 Polyp of corpus uteri: Secondary | ICD-10-CM | POA: Diagnosis not present

## 2017-05-06 HISTORY — DX: Dyspnea, unspecified: R06.00

## 2017-05-06 HISTORY — DX: Irritable bowel syndrome, unspecified: K58.9

## 2017-05-06 HISTORY — DX: Gastro-esophageal reflux disease without esophagitis: K21.9

## 2017-05-06 HISTORY — DX: Anxiety disorder, unspecified: F41.9

## 2017-05-06 HISTORY — DX: Acute myocardial infarction, unspecified: I21.9

## 2017-05-06 LAB — CBC
HEMATOCRIT: 42.6 % (ref 36.0–46.0)
Hemoglobin: 14.5 g/dL (ref 12.0–15.0)
MCH: 29.2 pg (ref 26.0–34.0)
MCHC: 34 g/dL (ref 30.0–36.0)
MCV: 85.9 fL (ref 78.0–100.0)
PLATELETS: 245 10*3/uL (ref 150–400)
RBC: 4.96 MIL/uL (ref 3.87–5.11)
RDW: 12.8 % (ref 11.5–15.5)
WBC: 8.7 10*3/uL (ref 4.0–10.5)

## 2017-05-06 NOTE — Pre-Procedure Instructions (Signed)
Reviewed medical history and medications with Dr Lyndle Herrlich.  Patient has been cleared for surgery by Dr. Wyline Copas, Cardiologist.  Clearance placed on patient's chart.  You can read chart notes and see 12/2016 EKG under documents in [place] ([facility]).  Patient has normal LV function, EF 55%.  EKG in 12/2016 was normal.  Dr Lyndle Herrlich did not order EKG or BMP lab draw.  No orders given.  [place] for surgery.

## 2017-05-07 ENCOUNTER — Encounter (HOSPITAL_COMMUNITY): Payer: Self-pay | Admitting: Anesthesiology

## 2017-05-07 NOTE — H&P (Signed)
NAMEBary Braxdon Gappa.:  [id number]  MEDICAL RECORD NO.:  [id number]  LOCATION:                                 FACILITY:  PHYSICIAN:  Lovenia Kim, M.D.     DATE OF BIRTH:  DATE OF ADMISSION: DATE OF DISCHARGE:                             HISTORY & PHYSICAL   CHIEF COMPLAINT:  Menometrorrhagia.  HISTORY OF PRESENT ILLNESS:  She is a 37 year old white female, G0, P0, for definitive therapy of abnormal uterine bleeding with structural lesion.  MEDICATIONS:  Include Xanax as needed, amlodipine, Singulair inhalers as needed, Nyquil, Claritin, reflux medication.  ALLERGIES:  SHE HAS ALLERGIES TO AUGMENTIN, AZITHROMYCIN, SULFA, NSAIDs, AND ASPIRIN.  FAMILY HISTORY:  Kidney disease, osteoporosis, hypertension, cancer, and Crohn disease.  She has a noncontributory pregnancy history.  PHYSICAL EXAMINATION:  GENERAL:  She is a well-developed, well-nourished white female, in no acute distress. HEENT:  Normal. NECK:  Supple.  Full range of motion. LUNGS:  Clear. HEART:  Regular rhythm. ABDOMEN:  Soft, nontender. PELVIC:  Reveals a bulky retroflexed uterus with a 4 cm posterior fibroid and a 2 cm anterior fibroid noted.  IMPRESSION:  Refractory menometrorrhagia with fibroids and questionable structural lesion.  PLAN:  For diagnostic hysteroscopy, D and C, MyoSure.  Risks of anesthesia, infection, bleeding, or surrounding organs, possible need for repair discussed, delayed versus immediate complications to include bowel and bladder injury noted.  The patient acknowledges and wishes to proceed.     Lovenia Kim, M.D.    RJT/MEDQ  D:  05/07/2017  T:  05/07/2017  Job:  [id number]

## 2017-05-07 NOTE — Anesthesia Preprocedure Evaluation (Addendum)
Anesthesia Evaluation  Patient identified by MRN, date of birth, ID band Patient awake    Reviewed: Allergy & Precautions, NPO status , Patient's Chart, lab work & pertinent test results  Airway Mallampati: II  TM Distance: >3 FB Neck ROM: Full    Dental no notable dental hx. (+) Teeth Intact   Pulmonary shortness of breath and with exertion, asthma ,  Exercise induced   Pulmonary exam normal breath sounds clear to auscultation       Cardiovascular + Past MI  Normal cardiovascular exam Rhythm:Regular Rate:Normal  Hx/o MI 2 years ago due to asthma attack and coronary vasospasm- on amlodipine now   Neuro/Psych Anxiety negative neurological ROS     GI/Hepatic Neg liver ROS, GERD  Medicated and Controlled,  Endo/Other  negative endocrine ROS  Renal/GU negative Renal ROS  negative genitourinary   Musculoskeletal negative musculoskeletal ROS (+)   Abdominal   Peds  Hematology negative hematology ROS (+)   Anesthesia Other Findings   Reproductive/Obstetrics AUB                           Anesthesia Physical Anesthesia Plan  ASA: II  Anesthesia Plan: General   Post-op Pain Management:    Induction: Intravenous  PONV Risk Score and Plan: 4 or greater and Scopolamine patch - Pre-op, Midazolam, Dexamethasone, Ondansetron and Treatment may vary due to age or medical condition  Airway Management Planned: LMA  Additional Equipment:   Intra-op Plan:   Post-operative Plan: Extubation in OR  Informed Consent: I have reviewed the patients History and Physical, chart, labs and discussed the procedure including the risks, benefits and alternatives for the proposed anesthesia with the patient or authorized representative who has indicated his/her understanding and acceptance.   Dental advisory given  Plan Discussed with: CRNA, Anesthesiologist and Surgeon  Anesthesia Plan Comments: (No  Ketorolac)       Anesthesia Quick Evaluation

## 2017-05-08 ENCOUNTER — Ambulatory Visit (HOSPITAL_COMMUNITY)
Admission: RE | Admit: 2017-05-08 | Discharge: 2017-05-08 | Disposition: A | Discharge status: Home / Self Care | Payer: BLUE CROSS/BLUE SHIELD | Source: Ambulatory Visit | Attending: Obstetrics and Gynecology | Admitting: Obstetrics and Gynecology

## 2017-05-08 ENCOUNTER — Other Ambulatory Visit: Payer: Self-pay

## 2017-05-08 ENCOUNTER — Ambulatory Visit (HOSPITAL_COMMUNITY): Payer: BLUE CROSS/BLUE SHIELD | Admitting: Certified Registered Nurse Anesthetist

## 2017-05-08 ENCOUNTER — Encounter (HOSPITAL_COMMUNITY): Payer: Self-pay

## 2017-05-08 ENCOUNTER — Ambulatory Visit (HOSPITAL_COMMUNITY): Payer: BLUE CROSS/BLUE SHIELD | Admitting: Anesthesiology

## 2017-05-08 ENCOUNTER — Encounter (HOSPITAL_COMMUNITY)
Admission: RE | Disposition: A | Discharge status: Home / Self Care | Payer: Self-pay | Source: Ambulatory Visit | Attending: Obstetrics and Gynecology

## 2017-05-08 DIAGNOSIS — N921 Excessive and frequent menstruation with irregular cycle: Secondary | ICD-10-CM | POA: Diagnosis not present

## 2017-05-08 DIAGNOSIS — J45990 Exercise induced bronchospasm: Secondary | ICD-10-CM | POA: Diagnosis not present

## 2017-05-08 DIAGNOSIS — J45909 Unspecified asthma, uncomplicated: Secondary | ICD-10-CM | POA: Diagnosis not present

## 2017-05-08 DIAGNOSIS — Z79899 Other long term (current) drug therapy: Secondary | ICD-10-CM | POA: Diagnosis not present

## 2017-05-08 DIAGNOSIS — F419 Anxiety disorder, unspecified: Secondary | ICD-10-CM | POA: Diagnosis not present

## 2017-05-08 DIAGNOSIS — Z882 Allergy status to sulfonamides status: Secondary | ICD-10-CM | POA: Diagnosis not present

## 2017-05-08 DIAGNOSIS — N840 Polyp of corpus uteri: Secondary | ICD-10-CM | POA: Insufficient documentation

## 2017-05-08 DIAGNOSIS — Z881 Allergy status to other antibiotic agents status: Secondary | ICD-10-CM | POA: Insufficient documentation

## 2017-05-08 DIAGNOSIS — N939 Abnormal uterine and vaginal bleeding, unspecified: Secondary | ICD-10-CM | POA: Diagnosis not present

## 2017-05-08 DIAGNOSIS — Z880 Allergy status to penicillin: Secondary | ICD-10-CM | POA: Insufficient documentation

## 2017-05-08 DIAGNOSIS — J309 Allergic rhinitis, unspecified: Secondary | ICD-10-CM | POA: Diagnosis not present

## 2017-05-08 DIAGNOSIS — I252 Old myocardial infarction: Secondary | ICD-10-CM | POA: Insufficient documentation

## 2017-05-08 DIAGNOSIS — K219 Gastro-esophageal reflux disease without esophagitis: Secondary | ICD-10-CM | POA: Diagnosis not present

## 2017-05-08 DIAGNOSIS — Z886 Allergy status to analgesic agent status: Secondary | ICD-10-CM | POA: Diagnosis not present

## 2017-05-08 DIAGNOSIS — L309 Dermatitis, unspecified: Secondary | ICD-10-CM | POA: Diagnosis not present

## 2017-05-08 HISTORY — PX: DILATATION & CURETTAGE/HYSTEROSCOPY WITH MYOSURE: SHX6511

## 2017-05-08 LAB — HCG, SERUM, QUALITATIVE: Preg, Serum: NEGATIVE

## 2017-05-08 SURGERY — DILATATION & CURETTAGE/HYSTEROSCOPY WITH MYOSURE
Anesthesia: General | Site: Vagina

## 2017-05-08 MED ORDER — SCOPOLAMINE 1 MG/3DAYS TD PT72
1.0000 | MEDICATED_PATCH | Freq: Once | TRANSDERMAL | Status: DC
  Administered 2017-05-08: 1.5 mg via TRANSDERMAL

## 2017-05-08 MED ORDER — PROPOFOL 10 MG/ML IV BOLUS
INTRAVENOUS | Status: AC
  Filled 2017-05-08: qty 20

## 2017-05-08 MED ORDER — FENTANYL CITRATE (PF) 100 MCG/2ML IJ SOLN
INTRAMUSCULAR | Status: AC
  Filled 2017-05-08: qty 2

## 2017-05-08 MED ORDER — DEXAMETHASONE SODIUM PHOSPHATE 10 MG/ML IJ SOLN
INTRAMUSCULAR | Status: AC
  Filled 2017-05-08: qty 1

## 2017-05-08 MED ORDER — PROPOFOL 10 MG/ML IV BOLUS
INTRAVENOUS | Status: DC | PRN
  Administered 2017-05-08: 100 mg via INTRAVENOUS
  Administered 2017-05-08: 200 mg via INTRAVENOUS

## 2017-05-08 MED ORDER — CEFAZOLIN SODIUM-DEXTROSE 2-4 GM/100ML-% IV SOLN
2.0000 g | INTRAVENOUS | Status: AC
  Administered 2017-05-08: 2 g via INTRAVENOUS

## 2017-05-08 MED ORDER — GLYCOPYRROLATE 0.2 MG/ML IJ SOLN
INTRAMUSCULAR | Status: AC
Start: 2017-05-08 — End: ?
  Filled 2017-05-08: qty 1

## 2017-05-08 MED ORDER — MIDAZOLAM HCL 2 MG/2ML IJ SOLN
INTRAMUSCULAR | Status: AC
  Filled 2017-05-08: qty 2

## 2017-05-08 MED ORDER — BUPIVACAINE HCL (PF) 0.25 % IJ SOLN
INTRAMUSCULAR | Status: AC
  Filled 2017-05-08: qty 30

## 2017-05-08 MED ORDER — BUPIVACAINE HCL (PF) 0.25 % IJ SOLN
INTRAMUSCULAR | Status: DC | PRN
  Administered 2017-05-08: 20 mL

## 2017-05-08 MED ORDER — TRAMADOL HCL 50 MG PO TABS: 50.0000 mg | ORAL_TABLET | Freq: Four times a day (QID) | ORAL | 0 refills | Status: DC | PRN

## 2017-05-08 MED ORDER — LIDOCAINE HCL (CARDIAC) 20 MG/ML IV SOLN
INTRAVENOUS | Status: AC
  Filled 2017-05-08: qty 5

## 2017-05-08 MED ORDER — GLYCOPYRROLATE 0.2 MG/ML IJ SOLN
INTRAMUSCULAR | Status: DC | PRN
  Administered 2017-05-08: 0.2 mg via INTRAVENOUS

## 2017-05-08 MED ORDER — LIDOCAINE HCL (CARDIAC) 20 MG/ML IV SOLN
INTRAVENOUS | Status: DC | PRN
  Administered 2017-05-08: 80 mg via INTRAVENOUS

## 2017-05-08 MED ORDER — SCOPOLAMINE 1 MG/3DAYS TD PT72
MEDICATED_PATCH | TRANSDERMAL | Status: AC
  Administered 2017-05-08: 1.5 mg via TRANSDERMAL
  Filled 2017-05-08: qty 1

## 2017-05-08 MED ORDER — SODIUM CHLORIDE 0.9 % IR SOLN
Status: DC | PRN
  Administered 2017-05-08: 3000 mL

## 2017-05-08 MED ORDER — HYDROCODONE-ACETAMINOPHEN 7.5-325 MG PO TABS: 1.0000 | ORAL_TABLET | Freq: Once | ORAL | Status: DC | PRN

## 2017-05-08 MED ORDER — DEXAMETHASONE SODIUM PHOSPHATE 4 MG/ML IJ SOLN
INTRAMUSCULAR | Status: DC | PRN
  Administered 2017-05-08: 8 mg via INTRAVENOUS

## 2017-05-08 MED ORDER — MEPERIDINE HCL 25 MG/ML IJ SOLN: 6.2500 mg | INTRAMUSCULAR | Status: DC | PRN

## 2017-05-08 MED ORDER — FENTANYL CITRATE (PF) 100 MCG/2ML IJ SOLN
INTRAMUSCULAR | Status: DC | PRN
  Administered 2017-05-08 (×2): 50 ug via INTRAVENOUS

## 2017-05-08 MED ORDER — MIDAZOLAM HCL 2 MG/2ML IJ SOLN
INTRAMUSCULAR | Status: DC | PRN
  Administered 2017-05-08 (×2): 1 mg via INTRAVENOUS

## 2017-05-08 MED ORDER — LACTATED RINGERS IV SOLN
INTRAVENOUS | Status: DC
  Administered 2017-05-08: 12:00:00 via INTRAVENOUS
  Administered 2017-05-08: 125 mL/h via INTRAVENOUS

## 2017-05-08 MED ORDER — FENTANYL CITRATE (PF) 100 MCG/2ML IJ SOLN: 25.0000 ug | INTRAMUSCULAR | Status: DC | PRN

## 2017-05-08 MED ORDER — ONDANSETRON HCL 4 MG/2ML IJ SOLN
INTRAMUSCULAR | Status: AC
  Filled 2017-05-08: qty 2

## 2017-05-08 MED ORDER — ONDANSETRON HCL 4 MG/2ML IJ SOLN
INTRAMUSCULAR | Status: DC | PRN
  Administered 2017-05-08: 4 mg via INTRAVENOUS

## 2017-05-08 MED ORDER — METOCLOPRAMIDE HCL 5 MG/ML IJ SOLN: 10.0000 mg | Freq: Once | INTRAMUSCULAR | Status: DC | PRN

## 2017-05-08 SURGICAL SUPPLY — 19 items
CANISTER SUCT 3000ML PPV (MISCELLANEOUS) ×4 IMPLANT
CATH ROBINSON RED A/P 16FR (CATHETERS) ×1 IMPLANT
DECANTER SPIKE VIAL GLASS SM (MISCELLANEOUS) IMPLANT
DEVICE MYOSURE LITE (MISCELLANEOUS) IMPLANT
DEVICE MYOSURE REACH (MISCELLANEOUS) ×2 IMPLANT
FILTER ARTHROSCOPY CONVERTOR (FILTER) ×2 IMPLANT
GLOVE BIO SURGEON STRL SZ7.5 (GLOVE) ×2 IMPLANT
GLOVE BIOGEL PI IND STRL 7.0 (GLOVE) ×1 IMPLANT
GLOVE BIOGEL PI INDICATOR 7.0 (GLOVE) ×1
GOWN STRL REUS W/TWL LRG LVL3 (GOWN DISPOSABLE) ×4 IMPLANT
NEEDLE SPNL 22GX3.5 QUINCKE BK (NEEDLE) IMPLANT
PACK VAGINAL MINOR WOMEN LF (CUSTOM PROCEDURE TRAY) ×2 IMPLANT
PAD OB MATERNITY 4.3X12.25 (PERSONAL CARE ITEMS) ×2 IMPLANT
SEAL ROD LENS SCOPE MYOSURE (ABLATOR) ×2 IMPLANT
SYR CONTROL 10ML LL (SYRINGE) ×2 IMPLANT
SYR TB 1ML 25GX5/8 (SYRINGE) ×1 IMPLANT
TOWEL OR 17X24 6PK STRL BLUE (TOWEL DISPOSABLE) ×4 IMPLANT
TUBING AQUILEX INFLOW (TUBING) ×2 IMPLANT
TUBING AQUILEX OUTFLOW (TUBING) ×2 IMPLANT

## 2017-05-08 NOTE — Op Note (Signed)
NAME:  Jordan Lara, Jordan Lara NO.:  [id number]  MEDICAL RECORD NO.:  [id number]  LOCATION:                                 FACILITY:  PHYSICIAN:  Lovenia Kim, M.D.     DATE OF BIRTH:  DATE OF PROCEDURE: DATE OF DISCHARGE:                              OPERATIVE REPORT   PREOPERATIVE DIAGNOSIS:  Menometrorrhagia with structural lesion.  POSTOPERATIVE DIAGNOSES:  Large endometrial polyp.  PROCEDURE:  Diagnostic hysteroscopy, D and C, MyoSure resection of endometrial polyp.  SURGEON:  Lovenia Kim, M.D.  ASSISTANT:  None.  ANESTHESIA:  General and local.  ESTIMATED BLOOD LOSS:  Less than 50 cc.  FLUID DEFICIT:  200 cc.  COMPLICATIONS:  None.  DRAINS:  None.  COUNTS:  Correct.  The patient to recovery in good condition.  BRIEF OPERATIVE NOTE:  After being apprised of the risks of anesthesia, infection, bleeding, injury to surrounding organs, possible need for repair, delayed versus immediate complications to include bowel and bladder injury, possible need for repair, the patient was brought to the operating room, where she was administered a general anesthetic without complications.  Prepped and draped in usual sterile fashion. Catheterized until the bladder was empty.  Exam under anesthesia reveals a retroflexed uterus and no adnexal masses.  Dilute Marcaine solution placed in a standard paracervical block, 20 cc total.  Cervix easily dilated up to 23 Pratt dilator.  Hysteroscope placed.  Visualization reveals a large protruded endometrial polyp, which was prolapsing in the endocervical canal.  MyoSure device was placed and resection of the polyp was done in multiple stages all the way down to the level of the endometrium.  A mild gentle sharp curettage was used to sample some of the tissue along the right lateral wall.  Bilateral normal tubal ostia noted.  Good hemostasis noted.  Fluid deficit as noted.  All instruments removed.  The patient  tolerated the procedure well and transferred to recovery in good condition.     Lovenia Kim, M.D.     RJT/MEDQ  D:  05/08/2017  T:  05/08/2017  Job:  [id number]  cc:   Lovenia Kim, M.D. Fax: [phone]

## 2017-05-08 NOTE — Discharge Instructions (Signed)
DISCHARGE INSTRUCTIONS: HYSTEROSCOPY  The following instructions have been prepared to help you care for yourself upon your return home.  May Remove Scop patch on or before 05/11/17.   Personal hygiene:  Use sanitary pads for vaginal drainage, not tampons.  Shower the day after your procedure.  NO tub baths, pools or Jacuzzis for 2-3 weeks.  Wipe front to back after using the bathroom.  Activity and limitations:  Do NOT drive or operate any equipment for 24 hours. The effects of anesthesia are still present and drowsiness may result.  Do NOT rest in bed all day.  Walking is encouraged.  Walk up and down stairs slowly.  You may resume your normal activity in one to two days or as indicated by your physician. Sexual activity: NO intercourse for at least 2 weeks after the procedure, or as indicated by your Doctor.  Diet: Eat a light meal as desired this evening. You may resume your usual diet tomorrow.  Return to Work: You may resume your work activities in one to two days or as indicated by Marine scientist.  What to expect after your surgery: Expect to have vaginal bleeding/discharge for 2-3 days and spotting for up to 10 days. It is not unusual to have soreness for up to 1-2 weeks. You may have a slight burning sensation when you urinate for the first day. Mild cramps may continue for a couple of days. You may have a regular period in 2-6 weeks.  Call your doctor for any of the following:  Excessive vaginal bleeding or clotting, saturating and changing one pad every hour.  Inability to urinate 6 hours after discharge from hospital.  Pain not relieved by pain medication.  Fever of 100.4 F or greater.  Unusual vaginal discharge or odor.  Return to office _________________Call for an appointment ___________________ Patients signature: ______________________ Nurses signature ________________________  [facility] [phone]

## 2017-05-08 NOTE — Progress Notes (Signed)
Patient ID: Jordan Lara, female   DOB: May 05, 1980, 37 y.o.   MRN: [id number] Patient seen and examined. Consent witnessed and signed. No changes noted. Update completed.

## 2017-05-08 NOTE — Transfer of Care (Signed)
Immediate Anesthesia Transfer of Care Note  Patient: Jordan Lara  Procedure(s) Performed: DILATATION & CURETTAGE/HYSTEROSCOPY WITH MYOSURE (N/A Vagina )  Patient Location: PACU  Anesthesia Type:General  Level of Consciousness: awake, alert  and oriented  Airway & Oxygen Therapy: Patient Spontanous Breathing and Patient connected to nasal cannula oxygen  Post-op Assessment: Report given to RN and Post -op Vital signs reviewed and stable  Post vital signs: Reviewed and stable  Last Vitals:  Vitals:   05/08/17 0916  BP: 108/69  Pulse: 96  Resp: 16  Temp: 36.8 C  SpO2: 100%    Last Pain:  Vitals:   05/08/17 0916  TempSrc: Oral      Patients Stated Pain Goal: 5 (71/83/67 2550)  Complications: No apparent anesthesia complications

## 2017-05-08 NOTE — Anesthesia Procedure Notes (Signed)
Procedure Name: LMA Insertion Date/Time: 05/08/2017 10:36 AM Performed by: Rayvon Char, CRNA Patient Re-evaluated:Patient Re-evaluated prior to induction Oxygen Delivery Method: Circle system utilized Preoxygenation: Pre-oxygenation with 100% oxygen Induction Type: IV induction Ventilation: Mask ventilation without difficulty LMA Size: 3.0 Placement Confirmation: breath sounds checked- equal and bilateral and positive ETCO2 Dental Injury: Teeth and Oropharynx as per pre-operative assessment

## 2017-05-08 NOTE — Anesthesia Postprocedure Evaluation (Signed)
Anesthesia Post Note  Patient: Jordan Lara  Procedure(s) Performed: DILATATION & CURETTAGE/HYSTEROSCOPY WITH MYOSURE (N/A Vagina )     Patient location during evaluation: PACU Anesthesia Type: General Level of consciousness: awake and alert Pain management: pain level controlled Vital Signs Assessment: post-procedure vital signs reviewed and stable Respiratory status: spontaneous breathing, nonlabored ventilation and respiratory function stable Cardiovascular status: blood pressure returned to baseline and stable Postop Assessment: no apparent nausea or vomiting Anesthetic complications: no    Last Vitals:  Vitals:   05/08/17 1145 05/08/17 1206  BP:  117/71  Pulse: (!) 102 87  Resp: (!) 22 16  Temp:  37 C  SpO2: 99% 98%    Last Pain:  Vitals:   05/08/17 1206  TempSrc:   PainSc: 2    Pain Goal: Patients Stated Pain Goal: 5 (05/08/17 0916)               Phoebie Shad A.

## 2017-05-08 NOTE — Op Note (Signed)
05/08/2017  11:07 AM  PATIENT:  Elberta Spaniel  37 y.o. female  PRE-OPERATIVE DIAGNOSIS:  Abnormal Uterine Bleeding  POST-OPERATIVE DIAGNOSIS:  Abnormal Uterine Bleeding Large endometrial polyp  PROCEDURE:  Procedure(s): DILATATION & CURETTAGE HYSTEROSCOPY WITH MYOSURE RESECTION OF POLYP  SURGEON:  Surgeon(s): Brien Few, MD  ASSISTANTS: none   ANESTHESIA:   local and general  ESTIMATED BLOOD LOSS: 5 mL   DRAINS: none   LOCAL MEDICATIONS USED:  MARCAINE    and Amount: 20 ml  SPECIMEN:  Source of Specimen:  EMC AND POLYP  DISPOSITION OF SPECIMEN:  PATHOLOGY  COUNTS:  YES  DICTATION #: [id number]  PLAN OF CARE: DC HOME  PATIENT DISPOSITION:  PACU - hemodynamically stable.

## 2017-05-09 ENCOUNTER — Encounter (HOSPITAL_COMMUNITY): Payer: Self-pay | Admitting: Obstetrics and Gynecology

## 2017-06-01 DIAGNOSIS — J3489 Other specified disorders of nose and nasal sinuses: Secondary | ICD-10-CM | POA: Diagnosis not present

## 2017-06-12 DIAGNOSIS — N939 Abnormal uterine and vaginal bleeding, unspecified: Secondary | ICD-10-CM | POA: Diagnosis not present

## 2017-07-30 DIAGNOSIS — J331 Polypoid sinus degeneration: Secondary | ICD-10-CM | POA: Diagnosis not present

## 2017-07-30 DIAGNOSIS — J330 Polyp of nasal cavity: Secondary | ICD-10-CM | POA: Diagnosis not present

## 2017-07-30 DIAGNOSIS — Z7289 Other problems related to lifestyle: Secondary | ICD-10-CM | POA: Diagnosis not present

## 2017-08-11 DIAGNOSIS — S62647D Nondisplaced fracture of proximal phalanx of left little finger, subsequent encounter for fracture with routine healing: Secondary | ICD-10-CM | POA: Diagnosis not present

## 2017-08-12 DIAGNOSIS — J339 Nasal polyp, unspecified: Secondary | ICD-10-CM | POA: Diagnosis not present

## 2017-08-12 DIAGNOSIS — J343 Hypertrophy of nasal turbinates: Secondary | ICD-10-CM | POA: Diagnosis not present

## 2017-08-18 DIAGNOSIS — Z01818 Encounter for other preprocedural examination: Secondary | ICD-10-CM | POA: Diagnosis not present

## 2017-08-18 DIAGNOSIS — R9431 Abnormal electrocardiogram [ECG] [EKG]: Secondary | ICD-10-CM | POA: Diagnosis not present

## 2017-08-18 DIAGNOSIS — J4530 Mild persistent asthma, uncomplicated: Secondary | ICD-10-CM | POA: Diagnosis not present

## 2017-08-18 DIAGNOSIS — K219 Gastro-esophageal reflux disease without esophagitis: Secondary | ICD-10-CM | POA: Diagnosis not present

## 2017-08-18 DIAGNOSIS — E785 Hyperlipidemia, unspecified: Secondary | ICD-10-CM | POA: Diagnosis not present

## 2017-08-18 DIAGNOSIS — I201 Angina pectoris with documented spasm: Secondary | ICD-10-CM | POA: Diagnosis not present

## 2017-08-25 DIAGNOSIS — J343 Hypertrophy of nasal turbinates: Secondary | ICD-10-CM | POA: Diagnosis not present

## 2017-08-25 DIAGNOSIS — J329 Chronic sinusitis, unspecified: Secondary | ICD-10-CM | POA: Diagnosis not present

## 2017-08-25 DIAGNOSIS — J339 Nasal polyp, unspecified: Secondary | ICD-10-CM | POA: Diagnosis not present

## 2017-08-25 DIAGNOSIS — J331 Polypoid sinus degeneration: Secondary | ICD-10-CM | POA: Diagnosis not present

## 2017-08-25 DIAGNOSIS — J3489 Other specified disorders of nose and nasal sinuses: Secondary | ICD-10-CM | POA: Diagnosis not present

## 2017-09-01 DIAGNOSIS — D2272 Melanocytic nevi of left lower limb, including hip: Secondary | ICD-10-CM | POA: Diagnosis not present

## 2017-09-01 DIAGNOSIS — D2261 Melanocytic nevi of right upper limb, including shoulder: Secondary | ICD-10-CM | POA: Diagnosis not present

## 2017-09-01 DIAGNOSIS — D225 Melanocytic nevi of trunk: Secondary | ICD-10-CM | POA: Diagnosis not present

## 2017-09-01 DIAGNOSIS — D485 Neoplasm of uncertain behavior of skin: Secondary | ICD-10-CM | POA: Diagnosis not present

## 2017-09-01 DIAGNOSIS — D1801 Hemangioma of skin and subcutaneous tissue: Secondary | ICD-10-CM | POA: Diagnosis not present

## 2017-10-02 DIAGNOSIS — H52223 Regular astigmatism, bilateral: Secondary | ICD-10-CM | POA: Diagnosis not present

## 2017-10-07 DIAGNOSIS — J330 Polyp of nasal cavity: Secondary | ICD-10-CM | POA: Diagnosis not present

## 2017-10-07 DIAGNOSIS — J4540 Moderate persistent asthma, uncomplicated: Secondary | ICD-10-CM | POA: Diagnosis not present

## 2017-10-08 DIAGNOSIS — L239 Allergic contact dermatitis, unspecified cause: Secondary | ICD-10-CM | POA: Diagnosis not present

## 2017-10-08 DIAGNOSIS — R0602 Shortness of breath: Secondary | ICD-10-CM | POA: Diagnosis not present

## 2017-10-08 DIAGNOSIS — J4530 Mild persistent asthma, uncomplicated: Secondary | ICD-10-CM | POA: Diagnosis not present

## 2017-11-10 DIAGNOSIS — J3489 Other specified disorders of nose and nasal sinuses: Secondary | ICD-10-CM | POA: Diagnosis not present

## 2018-07-08 DIAGNOSIS — J330 Polyp of nasal cavity: Secondary | ICD-10-CM | POA: Diagnosis not present

## 2018-08-12 DIAGNOSIS — J301 Allergic rhinitis due to pollen: Secondary | ICD-10-CM | POA: Diagnosis not present

## 2018-08-24 DIAGNOSIS — D225 Melanocytic nevi of trunk: Secondary | ICD-10-CM | POA: Diagnosis not present

## 2018-08-24 DIAGNOSIS — D1801 Hemangioma of skin and subcutaneous tissue: Secondary | ICD-10-CM | POA: Diagnosis not present

## 2018-08-24 DIAGNOSIS — D2272 Melanocytic nevi of left lower limb, including hip: Secondary | ICD-10-CM | POA: Diagnosis not present

## 2018-08-24 DIAGNOSIS — D2271 Melanocytic nevi of right lower limb, including hip: Secondary | ICD-10-CM | POA: Diagnosis not present

## 2018-08-24 DIAGNOSIS — D485 Neoplasm of uncertain behavior of skin: Secondary | ICD-10-CM | POA: Diagnosis not present

## 2018-08-24 DIAGNOSIS — L270 Generalized skin eruption due to drugs and medicaments taken internally: Secondary | ICD-10-CM | POA: Diagnosis not present

## 2018-08-30 DIAGNOSIS — D2272 Melanocytic nevi of left lower limb, including hip: Secondary | ICD-10-CM

## 2018-08-30 HISTORY — DX: Melanocytic nevi of left lower limb, including hip: D22.72

## 2018-08-31 ENCOUNTER — Ambulatory Visit (INDEPENDENT_AMBULATORY_CARE_PROVIDER_SITE_OTHER): Payer: BC Managed Care – PPO | Admitting: Internal Medicine

## 2018-08-31 ENCOUNTER — Encounter: Payer: Self-pay | Admitting: Internal Medicine

## 2018-08-31 ENCOUNTER — Other Ambulatory Visit: Payer: Self-pay

## 2018-08-31 VITALS — Ht 59.0 in | Wt 122.0 lb

## 2018-08-31 DIAGNOSIS — Z8379 Family history of other diseases of the digestive system: Secondary | ICD-10-CM | POA: Diagnosis not present

## 2018-08-31 DIAGNOSIS — K580 Irritable bowel syndrome with diarrhea: Secondary | ICD-10-CM

## 2018-08-31 DIAGNOSIS — K6289 Other specified diseases of anus and rectum: Secondary | ICD-10-CM | POA: Diagnosis not present

## 2018-08-31 DIAGNOSIS — Z800 Family history of malignant neoplasm of digestive organs: Secondary | ICD-10-CM | POA: Diagnosis not present

## 2018-08-31 NOTE — Progress Notes (Signed)
TELEHEALTH ENCOUNTER IN SETTING OF COVID-19 PANDEMIC - REQUESTED BY PATIENT SERVICE PROVIDED BY TELEMEDECINE - TYPE: zoom PATIENT LOCATION: home PATIENT HAS CONSENTED TO TELEHEALTH VISIT PROVIDER LOCATION: OFFICE REFERRING PROVIDER:n/A - self PARTICIPANTS OTHER THAN PATIENT:none TIME SPENT ON CALL 9 mins    Jordan Lara 38 y.o. Feb 21, 1981 [id number]  Assessment & Plan:   Encounter Diagnoses  Name Primary?  . Rectal pain Yes  . Irritable bowel syndrome with diarrhea predominance   . Family history of Crohn's disease   . Family history of rectal cancer     She needs an in office exam with a rectal plus minus anoscopy.  She could be developing signs of Crohn's disease like her father has had.  His rectal cancer was related to Crohn's disease so I do not think she needs an earlier colonoscopy than normal in that setting.  However depending upon what I find we could consider colonoscopy or sigmoidoscopy to look for inflammatory bowel disease which is in the range of possibilities here.  I appreciate the opportunity to care for this patient. CC: [name], MD   Subjective:   Chief Complaint:  HPI Veryl is experiencing intermittent rectal symptoms and anal pain.  Usually when she sits but she can feel a pressure there at times.  She has a diagnosis of irritable bowel syndrome, she was seen in 2017 and she was having voluminous frequent watery stools that I thought was a postinfectious IBS flare.  That did settle down and she has 1 or 2 stools a day that are often soft and smaller.  She does not have bleeding unless she over wipes and irritates things.  She does not have pain with defecation.  She eats lots of fiber she runs exercises and she says she eats well.  Recent medical history since I saw her significant for sinus surgery x2.  She is also been diagnosed with coronary artery spasm which is treated with amlodipine and caused 2 myocardial infarctions.  EF had fallen  to 50% but then that resolved.  Normalized.  Cardiac cath has documented the coronary artery spasm Allergies  Allergen Reactions  . Ansaid [Flurbiprofen] Anaphylaxis    Swelling, hives, SOB  . Aspirin Hives, Shortness Of Breath and Cough  . Augmentin [Amoxicillin-Pot Clavulanate] Other (See Comments)    Childhood allergy Has patient had a PCN reaction causing immediate rash, facial/tongue/throat swelling, SOB or lightheadedness with hypotension: Unknown Has patient had a PCN reaction causing severe rash involving mucus membranes or skin necrosis: Unknown Has patient had a PCN reaction that required hospitalization: Unknown Has patient had a PCN reaction occurring within the last 10 years: No If all of the above answers are "NO", then may proceed with Cephalosporin use.   . Azithromycin Other (See Comments)    Red splotches on skin  . Bee Venom Swelling    As a child  . Sulfonamide Derivatives Other (See Comments)    Childhood allergy   Current Meds  Medication Sig  . albuterol (PROVENTIL HFA;VENTOLIN HFA) 108 (90 Base) MCG/ACT inhaler Inhale 1-2 puffs into the lungs every 6 (six) hours as needed for wheezing or shortness of breath.  . AMBULATORY NON FORMULARY MEDICATION Allergy shots weekly  . amLODipine (NORVASC) 2.5 MG tablet Take 2.5 mg by mouth 2 (two) times daily.  Marland Kitchen azelastine (ASTELIN) 0.1 % nasal spray Place 1 spray into both nostrils daily as needed.   . B Complex-C-Folic Acid (STRESS B COMPLEX PO) Take 1 tablet  by mouth daily before breakfast.  . Calcium-Magnesium-Vitamin D (CALCIUM MAGNESIUM PO) One daily  . Cholecalciferol (D3 HIGH POTENCY) 50 MCG (2000 UT) CAPS One daily  . CITRUS BIOFLAVONOIDS PO One daily  . diazepam (VALIUM) 10 MG tablet Take 10 mg by mouth daily as needed for anxiety.  . diphenhydrAMINE (BENADRYL) 25 MG tablet Take 50 mg by mouth every 6 (six) hours as needed for itching or allergies.   Kendall Flack 575 MG/5ML SYRP 1 teaspoon daily  . EPINEPHrine  0.3 mg/0.3 mL IJ SOAJ injection Inject 0.3 mg into the muscle once.  . fluticasone (FLONASE) 50 MCG/ACT nasal spray Place 2 sprays into both nostrils daily as needed.   . fluticasone furoate-vilanterol (BREO ELLIPTA) 100-25 MCG/INH AEPB Inhale 1 puff into the lungs every evening.  . montelukast (SINGULAIR) 10 MG tablet TAKE 1 TABLET (10 MG TOTAL) BY MOUTH AT BEDTIME.  . Multiple Vitamins-Minerals (ZINC PO) 2 tablets by mouth daily  . nitroGLYCERIN (NITROSTAT) 0.4 MG SL tablet Place 0.4 mg under the tongue every 5 (five) minutes as needed for chest pain.  Marland Kitchen OVER THE COUNTER MEDICATION Emergen C paks, one daily  . OVER THE COUNTER MEDICATION Grape Fruit Seed Extract nasal spray, use 2-3 sprays each nostril 1-2 times daily  . pantoprazole (PROTONIX) 40 MG tablet Take 40 mg by mouth daily as needed (for acid reflux).  Marland Kitchen PRESCRIPTION MEDICATION Place 1 each into the nose 2 (two) times daily. Pt mixes budesonide liquid with saline in an irrigation bottle and flushes sinuses.  . Pseudoeph-Doxylamine-DM-APAP (NYQUIL PO) Take 30 mLs by mouth at bedtime as needed (for cough).  . triamcinolone cream (KENALOG) 0.1 % Use on rash prn   Past Medical History:  Diagnosis Date  . Allergic rhinitis 07/26/2015  . Anxiety   . Asthma    exercise induced asthma - dx as a child  . Coronary artery spasm ([place]) 2018   Documented on cardiac catheterization  . Dyspnea   . Eczema, allergic 07/26/2015  . GERD (gastroesophageal reflux disease)   . Heart attack ([place]) 05/2016   mini x 2 tx with amlodipine  . IBS (irritable bowel syndrome)   . Influenza A March 2017  . Seasonal allergies   . Spitz nevus of lower leg, left 08/30/2018   Past Surgical History:  Procedure Laterality Date  . cardiac cath  06/25/2016   [facility] N/A 05/08/2017   Procedure: [place];  Surgeon: Brien Few, MD;  Location: [facility];  Service: Gynecology;  Laterality: N/A;  . KNEE ARTHROSCOPY Right   . NASAL SINUS SURGERY     x 2  . TONSILLECTOMY AND ADENOIDECTOMY    . WISDOM TOOTH EXTRACTION     Social History   Social History Narrative   Married no children. 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 1 green tea daily. One alcoholic beverage daily.      Never smoker   EtoH3-4/week   No drugs   family history includes Crohn's disease in her father; Hyperlipidemia in her mother; Kidney failure in her father; Osteoporosis in her mother; Rectal cancer in her father; Stroke in her maternal grandmother.   Review of Systems As above

## 2018-08-31 NOTE — Patient Instructions (Signed)
As we discussed, I need to examine you in person to understand what might be causing your rectal symptoms.  We will contact you and arrange that appointment.  I appreciate the opportunity to care for you. Gatha Mayer, MD, [facility]

## 2018-09-02 ENCOUNTER — Telehealth: Payer: Self-pay

## 2018-09-02 NOTE — Telephone Encounter (Signed)
Covid-19 screening questions  Have you traveled in the last 14 days? no If yes where?  Do you now or have you had a fever in the last 14 days? no  Do you have any respiratory symptoms of shortness of breath or cough now or in the last 14 days? no  Do you have any family members or close contacts with diagnosed or suspected Covid-19 in the past 14 days? no  Have you been tested for Covid-19 and found to be positive? no       

## 2018-09-03 ENCOUNTER — Other Ambulatory Visit: Payer: Self-pay

## 2018-09-03 ENCOUNTER — Encounter: Payer: Self-pay | Admitting: Internal Medicine

## 2018-09-03 ENCOUNTER — Ambulatory Visit (INDEPENDENT_AMBULATORY_CARE_PROVIDER_SITE_OTHER)
Admission: RE | Admit: 2018-09-03 | Discharge: 2018-09-03 | Disposition: A | Discharge status: Home / Self Care | Payer: BC Managed Care – PPO | Source: Ambulatory Visit | Attending: Internal Medicine | Admitting: Internal Medicine

## 2018-09-03 ENCOUNTER — Other Ambulatory Visit (INDEPENDENT_AMBULATORY_CARE_PROVIDER_SITE_OTHER): Payer: BC Managed Care – PPO

## 2018-09-03 ENCOUNTER — Ambulatory Visit: Payer: BC Managed Care – PPO | Admitting: Internal Medicine

## 2018-09-03 VITALS — BP 114/74 | HR 102 | Temp 98.3°F | Ht 59.0 in | Wt 122.2 lb

## 2018-09-03 DIAGNOSIS — R197 Diarrhea, unspecified: Secondary | ICD-10-CM | POA: Diagnosis not present

## 2018-09-03 DIAGNOSIS — K580 Irritable bowel syndrome with diarrhea: Secondary | ICD-10-CM | POA: Diagnosis not present

## 2018-09-03 DIAGNOSIS — L309 Dermatitis, unspecified: Secondary | ICD-10-CM

## 2018-09-03 DIAGNOSIS — K6289 Other specified diseases of anus and rectum: Secondary | ICD-10-CM

## 2018-09-03 DIAGNOSIS — M533 Sacrococcygeal disorders, not elsewhere classified: Secondary | ICD-10-CM | POA: Diagnosis not present

## 2018-09-03 LAB — SEDIMENTATION RATE: Sed Rate: 9 mm/hr (ref 0–20)

## 2018-09-03 LAB — IGA: IgA: 147 mg/dL (ref 68–378)

## 2018-09-03 LAB — C-REACTIVE PROTEIN: CRP: <1 mg/dL (ref 0.5–20.0)

## 2018-09-03 MED ORDER — NYSTATIN-TRIAMCINOLONE 100000-0.1 UNIT/GM-% EX OINT: 1.0000 "application " | TOPICAL_OINTMENT | Freq: Two times a day (BID) | CUTANEOUS | 0 refills | Status: DC

## 2018-09-03 NOTE — Progress Notes (Signed)
Jordan Lara 38 y.o. Feb 11, 1981 [id number]  Assessment & Plan:   Encounter Diagnoses  Name Primary?  . Rectal pain Yes  . Perianal dermatitis   . Irritable bowel syndrome with diarrhea     I am not sure what is causing her rectal pain.  It could be musculoskeletal, functional type thing.  I doubt the perianal dermatitis is related but I will treat that.  Diagnostic testing as follows  Orders Placed This Encounter  Procedures  . DG Sacrum/Coccyx  . C-reactive protein  . Sedimentation rate  . Tissue transglutaminase, IgA  . IgA  Screening for celiac disease checking for bony abnormalities and screening for inflammation.  Further plans pending these results.  Perianal dermatitis treatment Meds ordered this encounter  Medications  . nystatin-triamcinolone ointment (MYCOLOG)    Sig: Apply 1 application topically 2 (two) times daily.    Dispense:  30 g    Refill:  0       Subjective:   Chief Complaint: Rectal pain  HPI Jordan Lara is here for an in person exam, telehealth visit on June 9 resulted in a diagnosis of rectal pain.  She is having difficulty when she bends or sits she can feel a tightness almost like a clenching.  She leans over to get something she feels it.  When she sits back on her buttocks with her legs in the air she can feel it.  It is been there about a year.  Since her father had rectal cancer she is very fearful of that.  She has had one episode of bright red blood with wiping in the past year.  She runs a lot and does yoga occasionally but does not cycle or sit on her bottom for long periods of time doing exercise.  Continues with 1 or 2 loose to watery stools a day.  It is been that way since she had a severe diarrheal illness in 2017.  Her husband was ill at the same time.  Her bowel pattern has never been like it was prior to that illness. Allergies  Allergen Reactions  . Ansaid [Flurbiprofen] Anaphylaxis    Swelling, hives, SOB  . Aspirin Hives,  Shortness Of Breath and Cough  . Augmentin [Amoxicillin-Pot Clavulanate] Other (See Comments)    Childhood allergy Has patient had a PCN reaction causing immediate rash, facial/tongue/throat swelling, SOB or lightheadedness with hypotension: Unknown Has patient had a PCN reaction causing severe rash involving mucus membranes or skin necrosis: Unknown Has patient had a PCN reaction that required hospitalization: Unknown Has patient had a PCN reaction occurring within the last 10 years: No If all of the above answers are "NO", then may proceed with Cephalosporin use.   . Azithromycin Other (See Comments)    Red splotches on skin  . Bee Venom Swelling    As a child  . Sulfonamide Derivatives Other (See Comments)    Childhood allergy   Current Meds  Medication Sig  . albuterol (PROVENTIL HFA;VENTOLIN HFA) 108 (90 Base) MCG/ACT inhaler Inhale 1-2 puffs into the lungs every 6 (six) hours as needed for wheezing or shortness of breath.  . AMBULATORY NON FORMULARY MEDICATION Allergy shots weekly  . amLODipine (NORVASC) 2.5 MG tablet Take 2.5 mg by mouth 2 (two) times daily.  Marland Kitchen azelastine (ASTELIN) 0.1 % nasal spray Place 1 spray into both nostrils daily as needed.   . B Complex-C-Folic Acid (STRESS B COMPLEX PO) Take 1 tablet by mouth daily before breakfast.  . Calcium-Magnesium-Vitamin  D (CALCIUM MAGNESIUM PO) One daily  . Cholecalciferol (D3 HIGH POTENCY) 50 MCG (2000 UT) CAPS One daily  . CITRUS BIOFLAVONOIDS PO One daily  . diazepam (VALIUM) 10 MG tablet Take 10 mg by mouth daily as needed for anxiety.  . diphenhydrAMINE (BENADRYL) 25 MG tablet Take 50 mg by mouth every 6 (six) hours as needed for itching or allergies.   Kendall Flack 575 MG/5ML SYRP 1 teaspoon daily  . fluticasone (FLONASE) 50 MCG/ACT nasal spray Place 2 sprays into both nostrils daily as needed.   . fluticasone furoate-vilanterol (BREO ELLIPTA) 100-25 MCG/INH AEPB Inhale 1 puff into the lungs every evening.  . montelukast  (SINGULAIR) 10 MG tablet TAKE 1 TABLET (10 MG TOTAL) BY MOUTH AT BEDTIME.  . Multiple Vitamins-Minerals (ZINC PO) 2 tablets by mouth daily  . nitroGLYCERIN (NITROSTAT) 0.4 MG SL tablet Place 0.4 mg under the tongue every 5 (five) minutes as needed for chest pain.  Marland Kitchen OVER THE COUNTER MEDICATION Emergen C paks, one daily  . OVER THE COUNTER MEDICATION Grape Fruit Seed Extract nasal spray, use 2-3 sprays each nostril 1-2 times daily  . pantoprazole (PROTONIX) 40 MG tablet Take 40 mg by mouth daily as needed (for acid reflux).  Marland Kitchen PRESCRIPTION MEDICATION Place 1 each into the nose 2 (two) times daily. Pt mixes budesonide liquid with saline in an irrigation bottle and flushes sinuses.  . Pseudoeph-Doxylamine-DM-APAP (NYQUIL PO) Take 30 mLs by mouth at bedtime as needed (for cough).  . triamcinolone cream (KENALOG) 0.1 % Use on rash prn   Past Medical History:  Diagnosis Date  . Allergic rhinitis 07/26/2015  . Anxiety   . Asthma    exercise induced asthma - dx as a child  . Coronary artery spasm ([place]) 2018   Documented on cardiac catheterization  . Dyspnea   . Eczema, allergic 07/26/2015  . GERD (gastroesophageal reflux disease)   . Heart attack ([place]) 05/2016   mini x 2 tx with amlodipine  . IBS (irritable bowel syndrome)   . Influenza A March 2017  . Seasonal allergies   . Spitz nevus of lower leg, left 08/30/2018   Past Surgical History:  Procedure Laterality Date  . cardiac cath  06/25/2016   [facility] N/A 05/08/2017   Procedure: [place];  Surgeon: Brien Few, MD;  Location: [facility];  Service: Gynecology;  Laterality: N/A;  . KNEE ARTHROSCOPY Right   . NASAL SINUS SURGERY     x 2  . TONSILLECTOMY AND ADENOIDECTOMY    . WISDOM TOOTH EXTRACTION     Social History   Social History Narrative   Married no children. 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 1 green tea daily. One alcoholic beverage daily.      Never smoker   EtoH3-4/week   No drugs   family history includes Crohn's disease in her father; Hyperlipidemia in her mother; Kidney failure in her father; Osteoporosis in her mother; Rectal cancer in her father; Stroke in her maternal grandmother.   Review of Systems  As per HPI Objective:   Physical Exam BP 114/74   Pulse (!) 102   Temp 98.3 F (36.8 C)   Ht 4\' 11"  (1.499 m)   Wt 122 lb 4 oz (55.5 kg)   BMI 24.69 kg/m  Petite white woman no acute distress  Female staff present Patti [place], [place] present.  Perianal exam shows a dermatitis and inflamed anterior tag minimal.  Digital rectal exam is nontender without mass.  Brown stool semi-formed present.  No tenderness in the region of the coccyx.   Anoscopic exam reveals normal anal and rectal mucosa.  No significant hemorrhoids.  No fissure.

## 2018-09-03 NOTE — Patient Instructions (Addendum)
We have sent the following medications to your pharmacy for you to pick up at your convenience: Kenalog cream, use twice daily for 2 weeks   Your provider has requested that you go to the basement level for lab work before leaving today. Press "B" on the elevator. The lab is located at the first door on the left as you exit the elevator.   Please go by the x-ray department before leaving today.   Try using a hair dryer after bowel movements to dry yourself instead of wiping.    I appreciate the opportunity to care for you. Silvano Rusk, MD, [facility]

## 2018-09-06 LAB — TISSUE TRANSGLUTAMINASE, IGA: (tTG) Ab, IgA: 1 U/mL

## 2018-09-06 NOTE — Progress Notes (Signed)
Normal To be called See result message under labs

## 2018-09-06 NOTE — Progress Notes (Signed)
Labs are all ok No celiac disease! Good news The sacrum and coccyx look ok on the xray  I am unable to explain her rectal pain symptoms but based upon the history, exam and xrays I would not work it up anymore and if she thinks she can live with it do that as I do not think anything serious going on.  One think to consider is having a GYN pelvic exam if that has not been done in some time.  Let me know if she has other questions/concerns

## 2018-09-14 DIAGNOSIS — D485 Neoplasm of uncertain behavior of skin: Secondary | ICD-10-CM | POA: Diagnosis not present

## 2018-09-29 DIAGNOSIS — L237 Allergic contact dermatitis due to plants, except food: Secondary | ICD-10-CM | POA: Diagnosis not present

## 2018-10-06 DIAGNOSIS — J330 Polyp of nasal cavity: Secondary | ICD-10-CM | POA: Diagnosis not present

## 2018-10-06 DIAGNOSIS — J4530 Mild persistent asthma, uncomplicated: Secondary | ICD-10-CM | POA: Diagnosis not present

## 2018-10-06 DIAGNOSIS — J301 Allergic rhinitis due to pollen: Secondary | ICD-10-CM | POA: Diagnosis not present

## 2018-10-12 DIAGNOSIS — J301 Allergic rhinitis due to pollen: Secondary | ICD-10-CM | POA: Diagnosis not present

## 2018-10-27 DIAGNOSIS — J301 Allergic rhinitis due to pollen: Secondary | ICD-10-CM | POA: Diagnosis not present

## 2018-11-01 DIAGNOSIS — R109 Unspecified abdominal pain: Secondary | ICD-10-CM | POA: Diagnosis not present

## 2018-11-01 DIAGNOSIS — Z1329 Encounter for screening for other suspected endocrine disorder: Secondary | ICD-10-CM | POA: Diagnosis not present

## 2018-11-03 DIAGNOSIS — J330 Polyp of nasal cavity: Secondary | ICD-10-CM | POA: Diagnosis not present

## 2018-11-15 DIAGNOSIS — N939 Abnormal uterine and vaginal bleeding, unspecified: Secondary | ICD-10-CM | POA: Diagnosis not present

## 2018-11-22 ENCOUNTER — Other Ambulatory Visit: Payer: Self-pay | Admitting: Obstetrics and Gynecology

## 2018-11-24 DIAGNOSIS — J331 Polypoid sinus degeneration: Secondary | ICD-10-CM | POA: Diagnosis not present

## 2018-11-24 DIAGNOSIS — J4530 Mild persistent asthma, uncomplicated: Secondary | ICD-10-CM | POA: Diagnosis not present

## 2018-11-24 DIAGNOSIS — J339 Nasal polyp, unspecified: Secondary | ICD-10-CM | POA: Diagnosis not present

## 2018-11-24 DIAGNOSIS — Z7289 Other problems related to lifestyle: Secondary | ICD-10-CM | POA: Diagnosis not present

## 2018-12-02 ENCOUNTER — Ambulatory Visit (HOSPITAL_BASED_OUTPATIENT_CLINIC_OR_DEPARTMENT_OTHER)
Admission: RE | Admit: 2018-12-02 | Payer: BC Managed Care – PPO | Source: Home / Self Care | Admitting: Obstetrics and Gynecology

## 2018-12-02 ENCOUNTER — Encounter (HOSPITAL_BASED_OUTPATIENT_CLINIC_OR_DEPARTMENT_OTHER): Admission: RE | Payer: Self-pay | Source: Home / Self Care

## 2018-12-02 DIAGNOSIS — I201 Angina pectoris with documented spasm: Secondary | ICD-10-CM | POA: Diagnosis not present

## 2018-12-02 DIAGNOSIS — Z6824 Body mass index (BMI) 24.0-24.9, adult: Secondary | ICD-10-CM | POA: Diagnosis not present

## 2018-12-02 SURGERY — DILATATION & CURETTAGE/HYSTEROSCOPY WITH MYOSURE
Anesthesia: General

## 2018-12-13 DIAGNOSIS — N920 Excessive and frequent menstruation with regular cycle: Secondary | ICD-10-CM | POA: Diagnosis not present

## 2018-12-15 DIAGNOSIS — Z7289 Other problems related to lifestyle: Secondary | ICD-10-CM | POA: Diagnosis not present

## 2018-12-15 DIAGNOSIS — J331 Polypoid sinus degeneration: Secondary | ICD-10-CM | POA: Diagnosis not present

## 2018-12-21 DIAGNOSIS — N840 Polyp of corpus uteri: Secondary | ICD-10-CM | POA: Diagnosis not present

## 2018-12-21 DIAGNOSIS — N9489 Other specified conditions associated with female genital organs and menstrual cycle: Secondary | ICD-10-CM | POA: Diagnosis not present

## 2018-12-21 DIAGNOSIS — N920 Excessive and frequent menstruation with regular cycle: Secondary | ICD-10-CM | POA: Diagnosis not present

## 2018-12-21 DIAGNOSIS — Z3202 Encounter for pregnancy test, result negative: Secondary | ICD-10-CM | POA: Diagnosis not present

## 2019-01-06 DIAGNOSIS — J330 Polyp of nasal cavity: Secondary | ICD-10-CM | POA: Diagnosis not present

## 2019-01-06 DIAGNOSIS — Z7289 Other problems related to lifestyle: Secondary | ICD-10-CM | POA: Diagnosis not present

## 2019-01-18 DIAGNOSIS — J339 Nasal polyp, unspecified: Secondary | ICD-10-CM | POA: Diagnosis not present

## 2019-01-18 DIAGNOSIS — J4550 Severe persistent asthma, uncomplicated: Secondary | ICD-10-CM | POA: Diagnosis not present

## 2019-01-18 DIAGNOSIS — J301 Allergic rhinitis due to pollen: Secondary | ICD-10-CM | POA: Diagnosis not present

## 2019-01-24 DIAGNOSIS — N939 Abnormal uterine and vaginal bleeding, unspecified: Secondary | ICD-10-CM | POA: Diagnosis not present

## 2019-02-08 DIAGNOSIS — J4550 Severe persistent asthma, uncomplicated: Secondary | ICD-10-CM | POA: Diagnosis not present

## 2019-03-03 DIAGNOSIS — J301 Allergic rhinitis due to pollen: Secondary | ICD-10-CM | POA: Diagnosis not present

## 2019-03-16 DIAGNOSIS — Z124 Encounter for screening for malignant neoplasm of cervix: Secondary | ICD-10-CM | POA: Diagnosis not present

## 2019-03-16 DIAGNOSIS — J301 Allergic rhinitis due to pollen: Secondary | ICD-10-CM | POA: Diagnosis not present

## 2019-03-16 DIAGNOSIS — Z6824 Body mass index (BMI) 24.0-24.9, adult: Secondary | ICD-10-CM | POA: Diagnosis not present

## 2019-03-16 DIAGNOSIS — Z01419 Encounter for gynecological examination (general) (routine) without abnormal findings: Secondary | ICD-10-CM | POA: Diagnosis not present

## 2019-03-30 DIAGNOSIS — J339 Nasal polyp, unspecified: Secondary | ICD-10-CM | POA: Diagnosis not present

## 2019-05-18 DIAGNOSIS — J338 Other polyp of sinus: Secondary | ICD-10-CM | POA: Diagnosis not present

## 2019-05-18 DIAGNOSIS — J301 Allergic rhinitis due to pollen: Secondary | ICD-10-CM | POA: Diagnosis not present

## 2019-06-10 DIAGNOSIS — J029 Acute pharyngitis, unspecified: Secondary | ICD-10-CM | POA: Diagnosis not present

## 2019-06-10 DIAGNOSIS — Z20822 Contact with and (suspected) exposure to covid-19: Secondary | ICD-10-CM | POA: Diagnosis not present

## 2019-07-14 DIAGNOSIS — J301 Allergic rhinitis due to pollen: Secondary | ICD-10-CM | POA: Diagnosis not present

## 2019-07-18 DIAGNOSIS — J301 Allergic rhinitis due to pollen: Secondary | ICD-10-CM | POA: Diagnosis not present

## 2019-07-18 DIAGNOSIS — J330 Polyp of nasal cavity: Secondary | ICD-10-CM | POA: Diagnosis not present

## 2019-08-09 DIAGNOSIS — J301 Allergic rhinitis due to pollen: Secondary | ICD-10-CM | POA: Diagnosis not present

## 2019-08-09 DIAGNOSIS — J4550 Severe persistent asthma, uncomplicated: Secondary | ICD-10-CM | POA: Diagnosis not present

## 2019-08-09 DIAGNOSIS — J339 Nasal polyp, unspecified: Secondary | ICD-10-CM | POA: Diagnosis not present

## 2019-08-10 DIAGNOSIS — J330 Polyp of nasal cavity: Secondary | ICD-10-CM | POA: Diagnosis not present

## 2019-08-30 DIAGNOSIS — D2262 Melanocytic nevi of left upper limb, including shoulder: Secondary | ICD-10-CM | POA: Diagnosis not present

## 2019-08-30 DIAGNOSIS — D224 Melanocytic nevi of scalp and neck: Secondary | ICD-10-CM | POA: Diagnosis not present

## 2019-08-30 DIAGNOSIS — D2261 Melanocytic nevi of right upper limb, including shoulder: Secondary | ICD-10-CM | POA: Diagnosis not present

## 2019-08-30 DIAGNOSIS — D225 Melanocytic nevi of trunk: Secondary | ICD-10-CM | POA: Diagnosis not present

## 2019-09-28 DIAGNOSIS — J330 Polyp of nasal cavity: Secondary | ICD-10-CM | POA: Diagnosis not present

## 2019-10-04 DIAGNOSIS — J339 Nasal polyp, unspecified: Secondary | ICD-10-CM | POA: Diagnosis not present

## 2019-11-01 DIAGNOSIS — J339 Nasal polyp, unspecified: Secondary | ICD-10-CM | POA: Diagnosis not present

## 2019-11-02 DIAGNOSIS — J330 Polyp of nasal cavity: Secondary | ICD-10-CM | POA: Diagnosis not present

## 2019-12-06 DIAGNOSIS — J339 Nasal polyp, unspecified: Secondary | ICD-10-CM | POA: Diagnosis not present

## 2019-12-08 DIAGNOSIS — J301 Allergic rhinitis due to pollen: Secondary | ICD-10-CM | POA: Diagnosis not present

## 2019-12-29 DIAGNOSIS — J4540 Moderate persistent asthma, uncomplicated: Secondary | ICD-10-CM | POA: Diagnosis not present

## 2020-01-03 DIAGNOSIS — J3489 Other specified disorders of nose and nasal sinuses: Secondary | ICD-10-CM | POA: Diagnosis not present

## 2020-01-03 DIAGNOSIS — J330 Polyp of nasal cavity: Secondary | ICD-10-CM | POA: Diagnosis not present

## 2020-01-07 DIAGNOSIS — G43019 Migraine without aura, intractable, without status migrainosus: Secondary | ICD-10-CM | POA: Diagnosis not present

## 2020-02-28 DIAGNOSIS — I201 Angina pectoris with documented spasm: Secondary | ICD-10-CM | POA: Diagnosis not present

## 2020-02-28 DIAGNOSIS — J45909 Unspecified asthma, uncomplicated: Secondary | ICD-10-CM | POA: Diagnosis not present

## 2020-02-28 DIAGNOSIS — I252 Old myocardial infarction: Secondary | ICD-10-CM | POA: Diagnosis not present

## 2020-03-05 DIAGNOSIS — J3489 Other specified disorders of nose and nasal sinuses: Secondary | ICD-10-CM | POA: Diagnosis not present

## 2020-03-05 DIAGNOSIS — J301 Allergic rhinitis due to pollen: Secondary | ICD-10-CM | POA: Diagnosis not present

## 2020-03-14 DIAGNOSIS — D251 Intramural leiomyoma of uterus: Secondary | ICD-10-CM | POA: Diagnosis not present

## 2020-03-14 DIAGNOSIS — N920 Excessive and frequent menstruation with regular cycle: Secondary | ICD-10-CM | POA: Diagnosis not present

## 2020-04-02 DIAGNOSIS — R519 Headache, unspecified: Secondary | ICD-10-CM | POA: Diagnosis not present

## 2020-04-16 ENCOUNTER — Encounter: Payer: Self-pay | Admitting: Neurology

## 2020-04-16 ENCOUNTER — Ambulatory Visit: Payer: BC Managed Care – PPO | Admitting: Neurology

## 2020-04-16 VITALS — BP 123/76 | HR 82 | Ht 59.0 in | Wt 140.0 lb

## 2020-04-16 DIAGNOSIS — R6884 Jaw pain: Secondary | ICD-10-CM

## 2020-04-16 DIAGNOSIS — R51 Headache with orthostatic component, not elsewhere classified: Secondary | ICD-10-CM

## 2020-04-16 DIAGNOSIS — R519 Headache, unspecified: Secondary | ICD-10-CM | POA: Diagnosis not present

## 2020-04-16 DIAGNOSIS — J329 Chronic sinusitis, unspecified: Secondary | ICD-10-CM | POA: Diagnosis not present

## 2020-04-16 DIAGNOSIS — H539 Unspecified visual disturbance: Secondary | ICD-10-CM | POA: Diagnosis not present

## 2020-04-16 MED ORDER — NURTEC 75 MG PO TBDP: 75.0000 mg | ORAL_TABLET | Freq: Every day | ORAL | 0 refills | Status: DC | PRN

## 2020-04-16 NOTE — Patient Instructions (Signed)
MRI of the brain w/wo contrast Triptans contraindicated in coronary artery spasm - try Nurtec (samples provided) Contact your ENT for exam or possibly CT sinuses  Rimegepant oral dissolving tablet What is this medicine? RIMEGEPANT (ri ME je pant) is used to treat migraine headaches with or without aura. An aura is a strange feeling or visual disturbance that warns you of an attack. It is also used to prevent migraine headaches. This medicine may be used for other purposes; ask your health care provider or pharmacist if you have questions. COMMON BRAND NAME(S): NURTEC ODT What should I tell my health care provider before I take this medicine? They need to know if you have any of these conditions:  kidney disease  liver disease  an unusual or allergic reaction to rimegepant, other medicines, foods, dyes, or preservatives  pregnant or trying to get pregnant  breast-feeding How should I use this medicine? Take the medicine by mouth. Follow the directions on the prescription label. Leave the tablet in the sealed blister pack until you are ready to take it. With dry hands, open the blister and gently remove the tablet. If the tablet breaks or crumbles, throw it away and take a new tablet out of the blister pack. Place the tablet in the mouth and allow it to dissolve, and then swallow. Do not cut, crush, or chew this medicine. You do not need water to take this medicine. Talk to your pediatrician about the use of this medicine in children. Special care may be needed. Overdosage: If you think you have taken too much of this medicine contact a poison control center or emergency room at once. NOTE: This medicine is only for you. Do not share this medicine with others. What if I miss a dose? This does not apply. This medicine is not for regular use. What may interact with this medicine? This medicine may interact with the following medications:  certain medicines for fungal infections like  fluconazole, itraconazole  rifampin This list may not describe all possible interactions. Give your health care provider a list of all the medicines, herbs, non-prescription drugs, or dietary supplements you use. Also tell them if you smoke, drink alcohol, or use illegal drugs. Some items may interact with your medicine. What should I watch for while using this medicine? Visit your health care professional for regular checks on your progress. Tell your health care professional if your symptoms do not start to get better or if they get worse. What side effects may I notice from receiving this medicine? Side effects that you should report to your doctor or health care professional as soon as possible:  allergic reactions like skin rash, itching or hives; swelling of the face, lips, or tongue Side effects that usually do not require medical attention (report these to your doctor or health care professional if they continue or are bothersome):  nausea This list may not describe all possible side effects. Call your doctor for medical advice about side effects. You may report side effects to FDA at 1-800-FDA-1088. Where should I keep my medicine? Keep out of the reach of children and pets. Store at room temperature between 20 and 25 degrees C (68 and 77 degrees F). Get rid of any unused medicine after the expiration date. To get rid of medicines that are no longer needed or have expired:  Take the medicine to a medicine take-back program. Check with your pharmacy or law enforcement to find a location.  If you cannot return the  medicine, check the label or package insert to see if the medicine should be thrown out in the garbage or flushed down the toilet. If you are not sure, ask your health care provider. If it is safe to put it in the trash, take the medicine out of the container. Mix the medicine with cat litter, dirt, coffee grounds, or other unwanted substance. Seal the mixture in a bag or  container. Put it in the trash. NOTE: This sheet is a summary. It may not cover all possible information. If you have questions about this medicine, talk to your doctor, pharmacist, or health care provider.  2021 Elsevier/Gold Standard (2019-08-23 17:56:55)

## 2020-04-16 NOTE — Progress Notes (Signed)
[facility]    Provider:  Dr Jaynee Eagles Requesting Provider: Jeannett Senior, PA* Primary Care Provider:  [name], MD  CC:  headaches  HPI:  Jordan Lara is a 40 y.o. female here as requested by Jeannett Senior, PA* for recurrent headaches.  Past medical history headaches, coronary artery spasm, deviated nasal septum, eczema, asthma, functional diarrhea, anxiety, GERD, IBS, menstrual migraine, mixed hyperlipidemia, chronic recurrent nasal polyps, primary insomnia, allergies, she is never smoked or used smokeless tobacco, 25 standard alcohol drinks a week no drugs.  I reviewed Jeannett Senior, PA*'s notes: Patient had a complaint of recurrent headaches, when she was seen on April 02, 2020 she stated it was her third episode, prior states were a result of nasal polyp removal, headache ongoing 3 days, started gradually and worsening over time, pain is mainly over the right side of the face and head, she has pain around the right eye right maxilla states teeth are throbbing, pain radiates into the right side of the head, associated photophobia, intermittent vision problems, she also reports nausea no vomiting, no numbness or weakness in the extremities, no fevers chills no neck pain or stiffness, the last several headaches resolved over time, she was seen for once before and given a prescription for Vicodin, prednisone taken the day before did not help, no sinus pain or congestion, no history of headaches prior to these last 3. Jeannett Senior, PA*'s examination was normal including physical exam, head, eyes, musculoskeletal, skin, neurologic, psychiatric, slightly elevated blood pressure 143/92 with pulse of 110 and BMI 27.7, diagnosed with acute non-intractable headache and referred here, was also treated with Fioricet and a Decadron injection with phenergan.  She was also started on Nurtec, Maxalt and Topamax(was on it 2 weeks with side effects).  She is here  alone, she had migraines in the past around menses these are different. These new headaches last a week, way different than prior migraines, they are severe, she "rocks in pain", They started about a year ago, first 2 associated with nasal polyp surgery a day or so later. The last one was a few weeks ago not associated with nasal polyps, she started having a slight headache in the evenings and then it started behind the right sinus and in the upper teeth, hasn't had a scan since 2018, this episode identical to the episodes a year ago except not followed by a visit to the ENT. Starts getting worse behind the maxillary sinus, then right forehead, light sensitivity, moves into the temple and top of the head, vibrating in both ear canal, some blurred vision, right ear pain, it is positional she wakes with it, continuous for a week, sharp pain right back of the head, tension in her back and chest, blood pressure increases due to the pain, feels like it is pushing her teeth out. She ahs chronic polyps, she was on prednisone last year for the polyps, 60mg , she has very thick mucous.   Reviewed notes, labs and imaging from outside physicians, which showed: see above  Review of Systems: Patient complains of symptoms per HPI as well as the following symptoms: sinus problems. Pertinent negatives and positives per HPI. All others negative.   Social History   Socioeconomic History  . Marital status: Married    Spouse name: Not on file  . Number of children: 0  . Years of education: Not on file  . Highest education level: Not on file  Occupational History  . Occupation: estate Press photographer  .  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 Occupation: house cleaning  Tobacco Use  . Smoking status: Never Smoker  . Smokeless tobacco: Never Used  Vaping Use  . Vaping Use: Never used  Substance and Sexual Activity  . Alcohol use: Yes    Alcohol/week: 3.0 - 5.0 standard drinks    Types: 3 - 5 Glasses of wine per week  . Drug use: No   . Sexual activity: Yes    Partners: Male    Birth control/protection: Condom  Other Topics Concern  . Not on file  Social History Narrative   Married no children. 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 1 green tea daily. One alcoholic beverage daily.      Never smoker   EtoH3-4/week   No drugs   Social Determinants of Radio broadcast assistant Strain: Not on file  Food Insecurity: Not on file  Transportation Needs: Not on file  Physical Activity: Not on file  Stress: Not on file  Social Connections: Not on file  Intimate Partner Violence: Not on file    Family History  Problem Relation Age of Onset  . Hyperlipidemia Mother   . Osteoporosis Mother   . GER disease Mother   . Crohn's disease Father   . Kidney failure Father   . Rectal cancer Father   . Aneurysm Father   . Stroke Maternal Grandmother   . Migraines Maternal Aunt   . Esophageal cancer Neg Hx     Past Medical History:  Diagnosis Date  . Acne rosacea   . Allergic rhinitis 07/26/2015  . Anxiety   . Asthma    exercise induced asthma - dx as a child  . Coronary artery spasm ([place]) 2018   Documented on cardiac catheterization  . Dyspnea   . Eczema, allergic 07/26/2015  . Heart attack ([place]) 05/2016   mini x 2 tx with amlodipine  . Hx of esophageal reflux   . Hypertrophy of inferior nasal turbinate   . IBS (irritable bowel syndrome)   . Influenza A March 2017  . Menstrual migraine   . Mixed hyperlipidemia    patient doesn't know anything about this   . Nasal sinus polyp    chronic, has had 2 procedures   . Primary insomnia   . Seasonal allergies   . Spitz nevus of lower leg, left 08/30/2018    Patient Active Problem List   Diagnosis Date Noted  . Right-sided headache 04/16/2020  . Allergic asthma 07/26/2015  . Allergic rhinitis 07/26/2015  . Eczema, allergic 07/26/2015  . Bee sting allergy 07/26/2015    Past Surgical History:  Procedure  Laterality Date  . cardiac cath  06/25/2016   [facility]  . CERVICAL POLYPECTOMY    . DILATATION & CURETTAGE/HYSTEROSCOPY WITH MYOSURE N/A 05/08/2017   Procedure: DILATATION & CURETTAGE/HYSTEROSCOPY WITH MYOSURE;  Surgeon: Brien Few, MD;  Location: [facility];  Service: Gynecology;  Laterality: N/A;  . KNEE ARTHROSCOPY Right   . NASAL ENDOSCOPY    . NASAL SINUS SURGERY     x 2  . TONSILLECTOMY AND ADENOIDECTOMY    . TURBINATE RESECTION    . WISDOM TOOTH EXTRACTION      Current Outpatient Medications  Medication Sig Dispense Refill  . albuterol (PROVENTIL HFA;VENTOLIN HFA) 108 (90 Base) MCG/ACT inhaler Inhale 1-2 puffs into the lungs every 6 (six) hours as needed for wheezing or shortness of breath.    . AMBULATORY NON  FORMULARY MEDICATION Allergy shots weekly    . amLODipine (NORVASC) 2.5 MG tablet Take 2.5 mg by mouth 2 (two) times daily.    . B Complex-C-Folic Acid (STRESS B COMPLEX PO) Take 1 tablet by mouth daily before breakfast.    . Calcium-Magnesium-Vitamin D (CALCIUM MAGNESIUM PO) One daily    . Cholecalciferol 50 MCG (2000 UT) CAPS One daily    . CITRUS BIOFLAVONOIDS PO One daily    . diazepam (VALIUM) 10 MG tablet Take 10 mg by mouth daily as needed for anxiety.    . diphenhydrAMINE (BENADRYL) 25 MG tablet Take 50 mg by mouth every 6 (six) hours as needed for itching or allergies.     Kendall Flack 575 MG/5ML SYRP 1 teaspoon daily    . EPINEPHrine 0.3 mg/0.3 mL IJ SOAJ injection Inject 0.3 mg into the muscle once.    . fluticasone (FLONASE) 50 MCG/ACT nasal spray Place 2 sprays into both nostrils daily as needed.     . fluticasone furoate-vilanterol (BREO ELLIPTA) 100-25 MCG/INH AEPB Inhale 1 puff into the lungs every evening.    . montelukast (SINGULAIR) 10 MG tablet TAKE 1 TABLET (10 MG TOTAL) BY MOUTH AT BEDTIME. 30 tablet 2  . Multiple Vitamins-Minerals (ZINC PO) 2 tablets by mouth daily    . nystatin-triamcinolone ointment (MYCOLOG) Apply 1  application topically 2 (two) times daily. 30 g 0  . OVER THE COUNTER MEDICATION Emergen C paks, one daily    . OVER THE COUNTER MEDICATION Grape Fruit Seed Extract nasal spray, use 2-3 sprays each nostril 1-2 times daily    . pantoprazole (PROTONIX) 40 MG tablet Take 40 mg by mouth daily as needed (for acid reflux).    Marland Kitchen PRESCRIPTION MEDICATION Place 1 each into the nose 2 (two) times daily. Pt mixes budesonide liquid with saline in an irrigation bottle and flushes sinuses.    . Rimegepant Sulfate (NURTEC) 75 MG TBDP Take 75 mg by mouth daily as needed. For migraines. Take as close to onset of migraine as possible. One daily maximum. 8 tablet 0   No current facility-administered medications for this visit.    Allergies as of 04/16/2020 - Review Complete 04/16/2020  Allergen Reaction Noted  . Ansaid [flurbiprofen] Anaphylaxis 08/30/2018  . Aspirin Hives, Shortness Of Breath, and Cough 03/18/2016  . Augmentin [amoxicillin-pot clavulanate] Other (See Comments) 07/26/2015  . Azithromycin Other (See Comments) 07/26/2015  . Bee venom Swelling 08/30/2018  . Sulfonamide derivatives Other (See Comments) 08/18/2007    Vitals: BP 123/76 (BP Location: Right Arm, Patient Position: Sitting)   Pulse 82   Ht 4\' 11"  (1.499 m)   Wt 140 lb (63.5 kg)   LMP 04/14/2020   BMI 28.28 kg/m  Last Weight:  Wt Readings from Last 1 Encounters:  04/16/20 140 lb (63.5 kg)   Last Height:   Ht Readings from Last 1 Encounters:  04/16/20 4\' 11"  (1.499 m)     Physical exam: Exam: Gen: NAD, conversant, well nourised, well groomed                     CV: RRR, no MRG. No Carotid Bruits. No peripheral edema, warm, nontender Eyes: Conjunctivae clear without exudates or hemorrhage  Neuro: Detailed Neurologic Exam  Speech:    Speech is normal; fluent and spontaneous with normal comprehension.  Cognition:    The patient is oriented to person, place, and time;     recent and remote memory intact;     language  fluent;     normal attention, concentration,     fund of knowledge Cranial Nerves:    The pupils are equal, round, and reactive to light. The fundi are normal and spontaneous venous pulsations are present. Visual fields are full to finger confrontation. Extraocular movements are intact. Trigeminal sensation is intact and the muscles of mastication are normal. The face is symmetric. The palate elevates in the midline. Hearing intact. Voice is normal. Shoulder shrug is normal. The tongue has normal motion without fasciculations.   Coordination:    No dysmetria or ataxia  Gait:    Normal native gait  Motor Observation:    No asymmetry, no atrophy, and no involuntary movements noted. Tone:    Normal muscle tone.    Posture:    Posture is normal. normal erect    Strength:    Strength is V/V in the upper and lower limbs.      Sensation: intact to LT     Reflex Exam:  DTR's:    Deep tendon reflexes in the upper and lower extremities are normal bilaterally.   Toes:    The toes are downgoing bilaterally.   Clonus:    Clonus is absent.    Assessment/Plan:  40 year old with new onset headache may be related to her sinuses however given concerning symptoms she needs brain imaging:  MRI of the brain w/wo contrast. MRI brain due to concerning symptoms of morning headaches, positional headaches,vision changes  to look for space occupying mass, chiari or intracranial hypertension (pseudotumor). Triptans contraindicated in coronary artery spasm - try Nurtec (samples provided) Contact your ENT for exam or possibly CT sinuses  Orders Placed This Encounter  Procedures  . MR BRAIN W WO CONTRAST  . Comprehensive metabolic panel  . CBC   Meds ordered this encounter  Medications  . Rimegepant Sulfate (NURTEC) 75 MG TBDP    Sig: Take 75 mg by mouth daily as needed. For migraines. Take as close to onset of migraine as possible. One daily maximum.    Dispense:  8 tablet    Refill:  0     [id number] [id number]    Cc: Jeannett Senior, PA*,  [name], MD  Sarina Ill, MD  [facility] [address] [place], [place] [address]  Phone [phone] Fax [phone]

## 2020-04-17 ENCOUNTER — Telehealth: Payer: Self-pay | Admitting: Neurology

## 2020-04-17 LAB — CBC
Hematocrit: 44.6 % (ref 34.0–46.6)
Hemoglobin: 15 g/dL (ref 11.1–15.9)
MCH: 29.2 pg (ref 26.6–33.0)
MCHC: 33.6 g/dL (ref 31.5–35.7)
MCV: 87 fL (ref 79–97)
Platelets: 245 10*3/uL (ref 150–450)
RBC: 5.14 x10E6/uL (ref 3.77–5.28)
RDW: 13.2 % (ref 11.7–15.4)
WBC: 6.4 10*3/uL (ref 3.4–10.8)

## 2020-04-17 LAB — COMPREHENSIVE METABOLIC PANEL
ALT: 16 IU/L (ref 0–32)
AST: 19 IU/L (ref 0–40)
Albumin/Globulin Ratio: 1.8 (ref 1.2–2.2)
Albumin: 4.2 g/dL (ref 3.8–4.8)
Alkaline Phosphatase: 74 IU/L (ref 44–121)
BUN/Creatinine Ratio: 11 (ref 9–23)
BUN: 10 mg/dL (ref 6–20)
Bilirubin Total: 0.2 mg/dL (ref 0.0–1.2)
CO2: 24 mmol/L (ref 20–29)
Calcium: 8.9 mg/dL (ref 8.7–10.2)
Chloride: 104 mmol/L (ref 96–106)
Creatinine, Ser: 0.92 mg/dL (ref 0.57–1.00)
GFR calc Af Amer: 91 mL/min/{1.73_m2} (ref >59)
GFR calc non Af Amer: 79 mL/min/{1.73_m2} (ref >59)
Globulin, Total: 2.4 g/dL (ref 1.5–4.5)
Glucose: 98 mg/dL (ref 65–99)
Potassium: 5.2 mmol/L (ref 3.5–5.2)
Sodium: 141 mmol/L (ref 134–144)
Total Protein: 6.6 g/dL (ref 6.0–8.5)

## 2020-04-17 NOTE — Telephone Encounter (Signed)
no to the covid questions MR Brain w/wo contrast Dr. Ihor Dow Josem Kaufmann: [id number] (exp. 04/17/20 to 10/13/20). Patient is scheduled at [facility] for 04/18/20.

## 2020-04-18 ENCOUNTER — Ambulatory Visit: Payer: BC Managed Care – PPO

## 2020-04-18 DIAGNOSIS — R51 Headache with orthostatic component, not elsewhere classified: Secondary | ICD-10-CM | POA: Diagnosis not present

## 2020-04-18 DIAGNOSIS — H539 Unspecified visual disturbance: Secondary | ICD-10-CM

## 2020-04-18 DIAGNOSIS — R519 Headache, unspecified: Secondary | ICD-10-CM | POA: Diagnosis not present

## 2020-04-18 MED ORDER — GADOBENATE DIMEGLUMINE 529 MG/ML IV SOLN
15.0000 mL | Freq: Once | INTRAVENOUS | Status: AC | PRN
  Administered 2020-04-18: 15 mL via INTRAVENOUS

## 2020-05-22 DIAGNOSIS — Z1331 Encounter for screening for depression: Secondary | ICD-10-CM | POA: Diagnosis not present

## 2020-05-22 DIAGNOSIS — E785 Hyperlipidemia, unspecified: Secondary | ICD-10-CM | POA: Diagnosis not present

## 2020-06-06 ENCOUNTER — Ambulatory Visit: Payer: BC Managed Care – PPO | Admitting: Neurology

## 2020-06-07 DIAGNOSIS — J338 Other polyp of sinus: Secondary | ICD-10-CM | POA: Diagnosis not present

## 2020-06-18 DIAGNOSIS — Z01411 Encounter for gynecological examination (general) (routine) with abnormal findings: Secondary | ICD-10-CM | POA: Diagnosis not present

## 2020-06-18 DIAGNOSIS — Z6827 Body mass index (BMI) 27.0-27.9, adult: Secondary | ICD-10-CM | POA: Diagnosis not present

## 2020-06-18 DIAGNOSIS — Z01419 Encounter for gynecological examination (general) (routine) without abnormal findings: Secondary | ICD-10-CM | POA: Diagnosis not present

## 2020-06-18 DIAGNOSIS — Z124 Encounter for screening for malignant neoplasm of cervix: Secondary | ICD-10-CM | POA: Diagnosis not present

## 2020-06-28 DIAGNOSIS — M25561 Pain in right knee: Secondary | ICD-10-CM | POA: Diagnosis not present

## 2020-07-02 DIAGNOSIS — M25562 Pain in left knee: Secondary | ICD-10-CM | POA: Diagnosis not present

## 2020-07-09 DIAGNOSIS — M25562 Pain in left knee: Secondary | ICD-10-CM | POA: Diagnosis not present

## 2020-07-09 DIAGNOSIS — M25561 Pain in right knee: Secondary | ICD-10-CM | POA: Diagnosis not present

## 2020-07-14 DIAGNOSIS — G43019 Migraine without aura, intractable, without status migrainosus: Secondary | ICD-10-CM | POA: Diagnosis not present

## 2020-07-24 DIAGNOSIS — M94261 Chondromalacia, right knee: Secondary | ICD-10-CM | POA: Diagnosis not present

## 2020-07-24 DIAGNOSIS — M25561 Pain in right knee: Secondary | ICD-10-CM | POA: Diagnosis not present

## 2020-08-06 ENCOUNTER — Telehealth: Payer: Self-pay | Admitting: *Deleted

## 2020-08-06 ENCOUNTER — Telehealth: Payer: Self-pay | Admitting: Neurology

## 2020-08-06 DIAGNOSIS — Z8709 Personal history of other diseases of the respiratory system: Secondary | ICD-10-CM | POA: Diagnosis not present

## 2020-08-06 DIAGNOSIS — R438 Other disturbances of smell and taste: Secondary | ICD-10-CM | POA: Diagnosis not present

## 2020-08-06 NOTE — Telephone Encounter (Signed)
I have a copy here for the patient.

## 2020-08-06 NOTE — Telephone Encounter (Signed)
Great !! Will you call her and let her know to come pick it up please?

## 2020-08-06 NOTE — Telephone Encounter (Signed)
Do we have a copy of her MRI on CD? Or does she have to go to [facility] to get one?

## 2020-08-07 NOTE — Telephone Encounter (Signed)
I called pt left voice mail, pt cd @ front desk for p/u.

## 2020-08-13 DIAGNOSIS — J708 Respiratory conditions due to other specified external agents: Secondary | ICD-10-CM | POA: Diagnosis not present

## 2020-08-13 DIAGNOSIS — R519 Headache, unspecified: Secondary | ICD-10-CM | POA: Diagnosis not present

## 2020-08-13 DIAGNOSIS — J324 Chronic pansinusitis: Secondary | ICD-10-CM | POA: Diagnosis not present

## 2020-08-13 DIAGNOSIS — Z881 Allergy status to other antibiotic agents status: Secondary | ICD-10-CM | POA: Diagnosis not present

## 2020-08-13 DIAGNOSIS — Z880 Allergy status to penicillin: Secondary | ICD-10-CM | POA: Diagnosis not present

## 2020-08-13 DIAGNOSIS — Z888 Allergy status to other drugs, medicaments and biological substances status: Secondary | ICD-10-CM | POA: Diagnosis not present

## 2020-08-13 DIAGNOSIS — J339 Nasal polyp, unspecified: Secondary | ICD-10-CM | POA: Diagnosis not present

## 2020-08-13 DIAGNOSIS — Z882 Allergy status to sulfonamides status: Secondary | ICD-10-CM | POA: Diagnosis not present

## 2020-08-13 DIAGNOSIS — J45909 Unspecified asthma, uncomplicated: Secondary | ICD-10-CM | POA: Diagnosis not present

## 2020-08-13 DIAGNOSIS — Z886 Allergy status to analgesic agent status: Secondary | ICD-10-CM | POA: Diagnosis not present

## 2020-08-16 DIAGNOSIS — E785 Hyperlipidemia, unspecified: Secondary | ICD-10-CM | POA: Diagnosis not present

## 2020-08-23 DIAGNOSIS — Z1331 Encounter for screening for depression: Secondary | ICD-10-CM | POA: Diagnosis not present

## 2020-08-23 DIAGNOSIS — Z0000 Encounter for general adult medical examination without abnormal findings: Secondary | ICD-10-CM | POA: Diagnosis not present

## 2020-08-23 DIAGNOSIS — Z1339 Encounter for screening examination for other mental health and behavioral disorders: Secondary | ICD-10-CM | POA: Diagnosis not present

## 2020-08-23 DIAGNOSIS — Z23 Encounter for immunization: Secondary | ICD-10-CM | POA: Diagnosis not present

## 2020-08-23 DIAGNOSIS — E785 Hyperlipidemia, unspecified: Secondary | ICD-10-CM | POA: Diagnosis not present

## 2020-09-11 DIAGNOSIS — D225 Melanocytic nevi of trunk: Secondary | ICD-10-CM | POA: Diagnosis not present

## 2020-09-11 DIAGNOSIS — D2239 Melanocytic nevi of other parts of face: Secondary | ICD-10-CM | POA: Diagnosis not present

## 2020-09-11 DIAGNOSIS — D2262 Melanocytic nevi of left upper limb, including shoulder: Secondary | ICD-10-CM | POA: Diagnosis not present

## 2020-09-11 DIAGNOSIS — D2261 Melanocytic nevi of right upper limb, including shoulder: Secondary | ICD-10-CM | POA: Diagnosis not present

## 2020-10-03 DIAGNOSIS — J324 Chronic pansinusitis: Secondary | ICD-10-CM | POA: Diagnosis not present

## 2020-10-03 DIAGNOSIS — J339 Nasal polyp, unspecified: Secondary | ICD-10-CM | POA: Diagnosis not present

## 2020-10-03 DIAGNOSIS — Z886 Allergy status to analgesic agent status: Secondary | ICD-10-CM | POA: Diagnosis not present

## 2020-10-03 DIAGNOSIS — J45909 Unspecified asthma, uncomplicated: Secondary | ICD-10-CM | POA: Diagnosis not present

## 2020-10-03 DIAGNOSIS — J3489 Other specified disorders of nose and nasal sinuses: Secondary | ICD-10-CM | POA: Diagnosis not present

## 2020-10-03 DIAGNOSIS — J342 Deviated nasal septum: Secondary | ICD-10-CM | POA: Diagnosis not present

## 2020-10-03 DIAGNOSIS — J320 Chronic maxillary sinusitis: Secondary | ICD-10-CM | POA: Diagnosis not present

## 2020-12-03 DIAGNOSIS — R0981 Nasal congestion: Secondary | ICD-10-CM | POA: Diagnosis not present

## 2020-12-03 DIAGNOSIS — J069 Acute upper respiratory infection, unspecified: Secondary | ICD-10-CM | POA: Diagnosis not present

## 2021-01-15 DIAGNOSIS — R051 Acute cough: Secondary | ICD-10-CM | POA: Diagnosis not present

## 2021-01-15 DIAGNOSIS — J324 Chronic pansinusitis: Secondary | ICD-10-CM | POA: Diagnosis not present

## 2021-01-17 DIAGNOSIS — J301 Allergic rhinitis due to pollen: Secondary | ICD-10-CM | POA: Diagnosis not present

## 2021-02-13 DIAGNOSIS — M8589 Other specified disorders of bone density and structure, multiple sites: Secondary | ICD-10-CM | POA: Diagnosis not present

## 2021-02-26 DIAGNOSIS — Z7189 Other specified counseling: Secondary | ICD-10-CM | POA: Diagnosis not present

## 2021-02-26 DIAGNOSIS — M549 Dorsalgia, unspecified: Secondary | ICD-10-CM | POA: Diagnosis not present

## 2021-02-26 DIAGNOSIS — J4530 Mild persistent asthma, uncomplicated: Secondary | ICD-10-CM | POA: Diagnosis not present

## 2021-03-01 DIAGNOSIS — J324 Chronic pansinusitis: Secondary | ICD-10-CM | POA: Diagnosis not present

## 2021-03-01 DIAGNOSIS — J302 Other seasonal allergic rhinitis: Secondary | ICD-10-CM | POA: Diagnosis not present

## 2021-03-01 DIAGNOSIS — J330 Polyp of nasal cavity: Secondary | ICD-10-CM | POA: Diagnosis not present

## 2021-03-01 DIAGNOSIS — J339 Nasal polyp, unspecified: Secondary | ICD-10-CM | POA: Diagnosis not present

## 2021-03-01 DIAGNOSIS — J45909 Unspecified asthma, uncomplicated: Secondary | ICD-10-CM | POA: Diagnosis not present

## 2021-03-01 DIAGNOSIS — Z886 Allergy status to analgesic agent status: Secondary | ICD-10-CM | POA: Diagnosis not present

## 2021-03-05 DIAGNOSIS — M545 Low back pain, unspecified: Secondary | ICD-10-CM | POA: Diagnosis not present

## 2021-03-07 DIAGNOSIS — J45901 Unspecified asthma with (acute) exacerbation: Secondary | ICD-10-CM | POA: Diagnosis not present

## 2021-03-13 ENCOUNTER — Ambulatory Visit: Payer: BC Managed Care – PPO | Admitting: Internal Medicine

## 2021-03-13 ENCOUNTER — Encounter: Payer: Self-pay | Admitting: Internal Medicine

## 2021-03-13 VITALS — BP 116/74 | HR 96 | Ht 59.25 in | Wt 140.2 lb

## 2021-03-13 DIAGNOSIS — Z800 Family history of malignant neoplasm of digestive organs: Secondary | ICD-10-CM

## 2021-03-13 DIAGNOSIS — M545 Low back pain, unspecified: Secondary | ICD-10-CM | POA: Diagnosis not present

## 2021-03-13 DIAGNOSIS — K6289 Other specified diseases of anus and rectum: Secondary | ICD-10-CM

## 2021-03-13 NOTE — Progress Notes (Signed)
Jordan Lara 40 y.o. Nov 19, 1980 [id number]  Assessment & Plan:   Encounter Diagnoses  Name Primary?   Rectal pain - intermittent, severe after defecation Yes   Family history of rectal cancer - father < 60      Schedule colonoscopy mainly because of the family history of rectal cancer.  The rectal pain is of unclear etiology.  I do not think this is a fissure given the episodic nature of it.  It could be a variant of proctalgia fugax.  The risks and benefits as well as alternatives of endoscopic procedure(s) have been discussed and reviewed. All questions answered. The patient agrees to proceed. CC: Sueanne Margarita, DO   Subjective:   Chief Complaint: Rectal pain family history of rectal cancer  HPI 40 year old woman known to me with a history of rectal pain and postinfectious IBS who is here to discuss scheduling a colonoscopy because of a family history and to follow-up regarding rectal pain.  About 10 times a year she will have a post defecatory severe rectal pain or spasm that is debilitating for about 2 or 3 minutes.  It is associated with nausea and diaphoresis but no syncope or presyncope.  She can never predict what is going to happen otherwise she does not have issues with defecation and bowel habits are regular and her irritable bowel phenomenon's are otherwise resolved.  Her father had rectal cancer diagnosed at about the age of 30.  When she was here previously we discussed to have scheduling a colonoscopy when she turns 40 which will be early next year.  She is inclined to schedule.  No rectal bleeding. Allergies  Allergen Reactions   Ansaid [Flurbiprofen] Anaphylaxis    Swelling, hives, SOB   Aspirin Hives, Shortness Of Breath and Cough   Nsaids Anaphylaxis, Hives and Swelling    ALL NSAIDS   Azithromycin Other (See Comments)    Red splotches on skin   Bee Venom Swelling    As a child   Sulfonamide Derivatives Other (See Comments)    Childhood allergy    Current Meds  Medication Sig   albuterol (PROVENTIL HFA;VENTOLIN HFA) 108 (90 Base) MCG/ACT inhaler Inhale 1-2 puffs into the lungs every 6 (six) hours as needed for wheezing or shortness of breath.   AMBULATORY NON FORMULARY MEDICATION Allergy shots weekly   amLODipine (NORVASC) 2.5 MG tablet Take 2.5 mg by mouth 2 (two) times daily.   Azelastine HCl 137 MCG/SPRAY SOLN Place 1 spray into both nostrils as needed.   B Complex-C-Folic Acid (STRESS B COMPLEX PO) Take 1 tablet by mouth daily before breakfast.   Calcium-Magnesium-Vitamin D (CALCIUM MAGNESIUM PO) One daily   Cholecalciferol 50 MCG (2000 UT) CAPS One daily   CITRUS BIOFLAVONOIDS PO One daily   diazepam (VALIUM) 10 MG tablet Take 10 mg by mouth daily as needed for anxiety.   diphenhydrAMINE (BENADRYL) 25 MG tablet Take 50 mg by mouth every 6 (six) hours as needed for itching or allergies.    Elderberry 575 MG/5ML SYRP 1 teaspoon daily   EPINEPHrine 0.3 mg/0.3 mL IJ SOAJ injection Inject 0.3 mg into the muscle once.   fluticasone (FLONASE) 50 MCG/ACT nasal spray Place 2 sprays into both nostrils daily as needed.    fluticasone furoate-vilanterol (BREO ELLIPTA) 200-25 MCG/ACT AEPB Inhale 1 puff into the lungs daily.   montelukast (SINGULAIR) 10 MG tablet TAKE 1 TABLET (10 MG TOTAL) BY MOUTH AT BEDTIME.   Multiple Vitamins-Minerals (ZINC PO) 2 tablets by mouth  daily   OVER THE COUNTER MEDICATION Emergen C paks, one daily   OVER THE COUNTER MEDICATION Grape Fruit Seed Extract nasal spray, use 2-3 sprays each nostril 1-2 times daily   PRESCRIPTION MEDICATION Place 1 each into the nose 2 (two) times daily. Pt mixes budesonide liquid with saline in an irrigation bottle and flushes sinuses.   XHANCE 93 MCG/ACT EXHU Place 1 spray into both nostrils 2 (two) times daily.   Past Medical History:  Diagnosis Date   Acne rosacea    Allergic rhinitis 07/26/2015   Anxiety    Asthma    exercise induced asthma - dx as a child   Coronary  artery spasm ([place]) 2018   Documented on cardiac catheterization   Dyspnea    Eczema, allergic 07/26/2015   Heart attack ([place]) 05/2016   mini x 2 tx with amlodipine   Hx of esophageal reflux    Hypertrophy of inferior nasal turbinate    IBS (irritable bowel syndrome)    Influenza A 05/2015   Menstrual migraine    Mixed hyperlipidemia    patient doesn't know anything about this    Nasal sinus polyp    chronic, has had 2 procedures    Osteopenia    Primary insomnia    Seasonal allergies    Spitz nevus of lower leg, left 08/30/2018   Past Surgical History:  Procedure Laterality Date   cardiac cath  06/25/2016   [facility] N/A 05/08/2017   Procedure: DILATATION & CURETTAGE/HYSTEROSCOPY WITH MYOSURE;  Surgeon: Brien Few, MD;  Location: [facility];  Service: Gynecology;  Laterality: N/A;   KNEE ARTHROSCOPY Right    NASAL ENDOSCOPY     NASAL SINUS SURGERY     x 2   TONSILLECTOMY AND ADENOIDECTOMY     TURBINATE RESECTION     WISDOM TOOTH EXTRACTION     Social History   Social History Narrative   Married no children. 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 1 green tea daily. One alcoholic beverage daily.      Never smoker   EtoH3-4/week   No drugs   family history includes Aneurysm in her father; Crohn's disease in her father; GER disease in her mother; Hyperlipidemia in her mother; Kidney failure in her father; Migraines in her maternal aunt; Osteoporosis in her mother; Rectal cancer in her father; Stroke in her maternal grandmother.   Review of Systems See HPI  Objective:   Physical Exam BP 116/74 (BP Location: Left Arm, Patient Position: Sitting, Cuff Size: Normal)    Pulse 96    Ht 4' 11.25" (1.505 m) Comment: height measured without shoes   Wt 140 lb 4 oz (63.6 kg)    LMP 03/11/2021    BMI 28.09 kg/m  Lungs  clear Normal heart sounds Abdomen soft nontender benign not organomegaly or mass Rectal exam is deferred until colonoscopy

## 2021-03-13 NOTE — Patient Instructions (Signed)
You have been scheduled for a colonoscopy. Please follow written instructions given to you at your visit today.  Please pick up your prep supplies at the pharmacy within the next 1-3 days. If you use inhalers (even only as needed), please bring them with you on the day of your procedure.  If you are age 39 or older, your body mass index should be between 23-30. Your Body mass index is 28.09 kg/m. If this is out of the aforementioned range listed, please consider follow up with your Primary Care Provider.  If you are age 28 or younger, your body mass index should be between 19-25. Your Body mass index is 28.09 kg/m. If this is out of the aformentioned range listed, please consider follow up with your Primary Care Provider.   ________________________________________________________  The [facility] providers would like to encourage you to use [facility] to communicate with providers for non-urgent requests or questions.  Due to long hold times on the telephone, sending your provider a message by [facility] may be a faster and more efficient way to get a response.  Please allow 48 business hours for a response.  Please remember that this is for non-urgent requests.  _______________________________________________________    I appreciate the opportunity to care for you. Silvano Rusk, MD, [facility]

## 2021-04-02 ENCOUNTER — Encounter: Payer: Self-pay | Admitting: Internal Medicine

## 2021-04-03 DIAGNOSIS — I252 Old myocardial infarction: Secondary | ICD-10-CM | POA: Diagnosis not present

## 2021-04-03 DIAGNOSIS — Z6827 Body mass index (BMI) 27.0-27.9, adult: Secondary | ICD-10-CM | POA: Diagnosis not present

## 2021-04-03 DIAGNOSIS — I201 Angina pectoris with documented spasm: Secondary | ICD-10-CM | POA: Diagnosis not present

## 2021-04-03 DIAGNOSIS — J45909 Unspecified asthma, uncomplicated: Secondary | ICD-10-CM | POA: Diagnosis not present

## 2021-04-09 ENCOUNTER — Encounter: Payer: Self-pay | Admitting: Internal Medicine

## 2021-04-09 ENCOUNTER — Ambulatory Visit (AMBULATORY_SURGERY_CENTER): Payer: BC Managed Care – PPO | Admitting: Internal Medicine

## 2021-04-09 ENCOUNTER — Other Ambulatory Visit: Payer: Self-pay

## 2021-04-09 VITALS — BP 98/62 | HR 70 | Temp 98.0°F | Resp 16 | Ht 59.0 in | Wt 140.0 lb

## 2021-04-09 DIAGNOSIS — Z800 Family history of malignant neoplasm of digestive organs: Secondary | ICD-10-CM | POA: Diagnosis not present

## 2021-04-09 DIAGNOSIS — Z1211 Encounter for screening for malignant neoplasm of colon: Secondary | ICD-10-CM | POA: Diagnosis not present

## 2021-04-09 MED ORDER — SODIUM CHLORIDE 0.9 % IV SOLN: 500.0000 mL | Freq: Once | INTRAVENOUS | Status: DC

## 2021-04-09 NOTE — Patient Instructions (Addendum)
The colonoscopy is normal - no polyps, cancer or other abnormalities. Your next routine colonoscopy should be in 5 years - 2028.  I appreciate the opportunity to care for you. Gatha Mayer, MD, [facility]  Resume previous medications.    YOU HAD AN ENDOSCOPIC PROCEDURE TODAY AT [facility]:   Refer to the procedure report that was given to you for any specific questions about what was found during the examination.  If the procedure report does not answer your questions, please call your gastroenterologist to clarify.  If you requested that your care partner not be given the details of your procedure findings, then the procedure report has been included in a sealed envelope for you to review at your convenience later.  YOU SHOULD EXPECT: Some feelings of bloating in the abdomen. Passage of more gas than usual.  Walking can help get rid of the air that was put into your GI tract during the procedure and reduce the bloating. If you had a lower endoscopy (such as a colonoscopy or flexible sigmoidoscopy) you may notice spotting of blood in your stool or on the toilet paper. If you underwent a bowel prep for your procedure, you may not have a normal bowel movement for a few days.  Please Note:  You might notice some irritation and congestion in your nose or some drainage.  This is from the oxygen used during your procedure.  There is no need for concern and it should clear up in a day or so.  SYMPTOMS TO REPORT IMMEDIATELY:  Following lower endoscopy (colonoscopy or flexible sigmoidoscopy):  Excessive amounts of blood in the stool  Significant tenderness or worsening of abdominal pains  Swelling of the abdomen that is new, acute  Fever of 100F or higher  For urgent or emergent issues, a gastroenterologist can be reached at any hour by calling [phone]. Do not use MyChart messaging for urgent concerns.    DIET:  We do recommend a small meal at first, but then you may proceed  to your regular diet.  Drink plenty of fluids but you should avoid alcoholic beverages for 24 hours.  ACTIVITY:  You should plan to take it easy for the rest of today and you should NOT DRIVE or use heavy machinery until tomorrow (because of the sedation medicines used during the test).    FOLLOW UP: Our staff will call the number listed on your records 48-72 hours following your procedure to check on you and address any questions or concerns that you may have regarding the information given to you following your procedure. If we do not reach you, we will leave a message.  We will attempt to reach you two times.  During this call, we will ask if you have developed any symptoms of COVID 19. If you develop any symptoms (ie: fever, flu-like symptoms, shortness of breath, cough etc.) before then, please call [phone].  If you test positive for Covid 19 in the 2 weeks post procedure, please call and report this information to [place].    If any biopsies were taken you will be contacted by phone or by letter within the next 1-3 weeks.  Please call us at [phone] if you have not heard about the biopsies in 3 weeks.    SIGNATURES/CONFIDENTIALITY: You and/or your care partner have signed paperwork which will be entered into your electronic medical record.  These signatures attest to the fact that that the information above on your After Visit  Summary has been reviewed and is understood.  Full responsibility of the confidentiality of this discharge information lies with you and/or your care-partner.

## 2021-04-09 NOTE — Progress Notes (Signed)
Pt awake, report to RN, VVS Pt awake, report to RN, VVS

## 2021-04-09 NOTE — Op Note (Signed)
[facility] Patient Name: Jordan Lara Procedure Date: 04/09/2021 9:53 AM MRN: [id number] Endoscopist: Gatha Mayer , MD Age: 41 Referring MD:  Date of Birth: Jun 28, 1980 Gender: Female Account #: [id number] Procedure:                Colonoscopy Indications:              Screening in patient at increased risk: Colorectal                            cancer in father before age 40 Medicines:                Propofol per Anesthesia, Monitored Anesthesia Care Procedure:                Pre-Anesthesia Assessment:                           - Prior to the procedure, a History and Physical                            was performed, and patient medications and                            allergies were reviewed. The patient's tolerance of                            previous anesthesia was also reviewed. The risks                            and benefits of the procedure and the sedation                            options and risks were discussed with the patient.                            All questions were answered, and informed consent                            was obtained. Prior Anticoagulants: The patient has                            taken no previous anticoagulant or antiplatelet                            agents. ASA Grade Assessment: III - A patient with                            severe systemic disease. After reviewing the risks                            and benefits, the patient was deemed in                            satisfactory condition to undergo the procedure.  After obtaining informed consent, the colonoscope                            was passed under direct vision. Throughout the                            procedure, the patient's blood pressure, pulse, and                            oxygen saturations were monitored continuously. The                            CF [id number] #[id number] was introduced through the anus                             and advanced to the the cecum, identified by                            appendiceal orifice and ileocecal valve. The                            colonoscopy was performed without difficulty. The                            patient tolerated the procedure well. The quality                            of the bowel preparation was good. The ileocecal                            valve, appendiceal orifice, and rectum were                            photographed. The bowel preparation used was                            Miralax via split dose instruction. Scope In: 10:05:33 AM Scope Out: 10:18:09 AM Scope Withdrawal Time: 0 hours 10 minutes 4 seconds  Total Procedure Duration: 0 hours 12 minutes 36 seconds  Findings:                 The perianal and digital rectal examinations were                            normal.                           The entire examined colon appeared normal on direct                            and retroflexion views. Complications:            No immediate complications. Estimated Blood Loss:     Estimated blood loss: none. Impression:               - The entire examined  colon is normal on direct and                            retroflexion views.                           - No specimens collected. Recommendation:           - Patient has a contact number available for                            emergencies. The signs and symptoms of potential                            delayed complications were discussed with the                            patient. Return to normal activities tomorrow.                            Written discharge instructions were provided to the                            patient.                           - Resume previous diet.                           - Continue present medications.                           - Repeat colonoscopy in 5 years for screening                            purposes. Father had rectal cancer dx age 11 Gatha Mayer,  MD 04/09/2021 10:26:31 AM This report has been signed electronically.

## 2021-04-09 NOTE — Progress Notes (Signed)
Pt's states no medical or surgical changes since previsit or office visit. 

## 2021-04-09 NOTE — Progress Notes (Signed)
Sedate, gd SR, tolerated procedure well, VSS, report to RN 

## 2021-04-09 NOTE — Progress Notes (Signed)
History and Physical Interval Note:  04/09/2021 9:52 AM  Jordan Lara  has presented today for endoscopic procedure(s), with the diagnosis of  Encounter Diagnosis  Name Primary?   Family history of rectal cancer Yes  .  The various methods of evaluation and treatment have been discussed with the patient and/or family. After consideration of risks, benefits and other options for treatment, the patient has consented to  the endoscopic procedure(s).   The patient's history has been reviewed, patient examined, no change in status, stable for endoscopic procedure(s).  I have reviewed the patient's chart and labs.  Questions were answered to the patient's satisfaction.     Gatha Mayer, MD, [facility]

## 2021-04-11 ENCOUNTER — Telehealth: Payer: Self-pay

## 2021-04-11 NOTE — Telephone Encounter (Signed)
Attempted f/u call. No answer, left VM. 

## 2021-04-11 NOTE — Telephone Encounter (Signed)
°  Follow up Call-  Call back number 04/09/2021  Post procedure Call Back phone  # [phone]  Permission to leave phone message Yes  Some recent data might be hidden    F/u call attempted, no answer, left voicemail.

## 2021-04-24 DIAGNOSIS — J339 Nasal polyp, unspecified: Secondary | ICD-10-CM | POA: Diagnosis not present

## 2021-04-24 DIAGNOSIS — Z4582 Encounter for adjustment or removal of myringotomy device (stent) (tube): Secondary | ICD-10-CM | POA: Diagnosis not present

## 2021-04-24 DIAGNOSIS — J324 Chronic pansinusitis: Secondary | ICD-10-CM | POA: Diagnosis not present

## 2021-04-24 DIAGNOSIS — J330 Polyp of nasal cavity: Secondary | ICD-10-CM | POA: Diagnosis not present

## 2021-04-24 DIAGNOSIS — H6591 Unspecified nonsuppurative otitis media, right ear: Secondary | ICD-10-CM | POA: Diagnosis not present

## 2021-04-24 DIAGNOSIS — J45909 Unspecified asthma, uncomplicated: Secondary | ICD-10-CM | POA: Diagnosis not present

## 2021-04-24 DIAGNOSIS — Z886 Allergy status to analgesic agent status: Secondary | ICD-10-CM | POA: Diagnosis not present

## 2021-04-25 DIAGNOSIS — U071 COVID-19: Secondary | ICD-10-CM | POA: Diagnosis not present

## 2021-04-28 DIAGNOSIS — I252 Old myocardial infarction: Secondary | ICD-10-CM | POA: Diagnosis not present

## 2021-05-02 DIAGNOSIS — J4541 Moderate persistent asthma with (acute) exacerbation: Secondary | ICD-10-CM | POA: Diagnosis not present

## 2021-05-14 DIAGNOSIS — N939 Abnormal uterine and vaginal bleeding, unspecified: Secondary | ICD-10-CM | POA: Diagnosis not present

## 2021-05-14 DIAGNOSIS — N841 Polyp of cervix uteri: Secondary | ICD-10-CM | POA: Diagnosis not present

## 2021-05-17 DIAGNOSIS — N939 Abnormal uterine and vaginal bleeding, unspecified: Secondary | ICD-10-CM | POA: Diagnosis not present

## 2021-05-17 DIAGNOSIS — Z302 Encounter for sterilization: Secondary | ICD-10-CM | POA: Diagnosis not present

## 2021-05-17 DIAGNOSIS — D251 Intramural leiomyoma of uterus: Secondary | ICD-10-CM | POA: Diagnosis not present

## 2021-05-22 DIAGNOSIS — J338 Other polyp of sinus: Secondary | ICD-10-CM | POA: Diagnosis not present

## 2021-05-23 DIAGNOSIS — M85852 Other specified disorders of bone density and structure, left thigh: Secondary | ICD-10-CM | POA: Diagnosis not present

## 2021-05-23 DIAGNOSIS — M85851 Other specified disorders of bone density and structure, right thigh: Secondary | ICD-10-CM | POA: Diagnosis not present

## 2021-05-23 DIAGNOSIS — J301 Allergic rhinitis due to pollen: Secondary | ICD-10-CM | POA: Diagnosis not present

## 2021-05-23 DIAGNOSIS — E559 Vitamin D deficiency, unspecified: Secondary | ICD-10-CM | POA: Diagnosis not present

## 2021-05-23 DIAGNOSIS — T380X5A Adverse effect of glucocorticoids and synthetic analogues, initial encounter: Secondary | ICD-10-CM | POA: Diagnosis not present

## 2021-05-29 DIAGNOSIS — R5383 Other fatigue: Secondary | ICD-10-CM | POA: Diagnosis not present

## 2021-05-29 DIAGNOSIS — R0981 Nasal congestion: Secondary | ICD-10-CM | POA: Diagnosis not present

## 2021-05-29 DIAGNOSIS — J4541 Moderate persistent asthma with (acute) exacerbation: Secondary | ICD-10-CM | POA: Diagnosis not present

## 2021-06-06 DIAGNOSIS — H43812 Vitreous degeneration, left eye: Secondary | ICD-10-CM | POA: Diagnosis not present

## 2021-06-06 DIAGNOSIS — H04123 Dry eye syndrome of bilateral lacrimal glands: Secondary | ICD-10-CM | POA: Diagnosis not present

## 2021-06-09 IMAGING — DX SACRUM AND COCCYX - 2+ VIEW
3 series · 3 of 3 positions shown · non-contrast
Comparison: None.

CLINICAL DATA: Chronic coccygeal pain for the past year.

EXAM:
SACRUM AND COCCYX - 2+ VIEW

[sacrum ap]
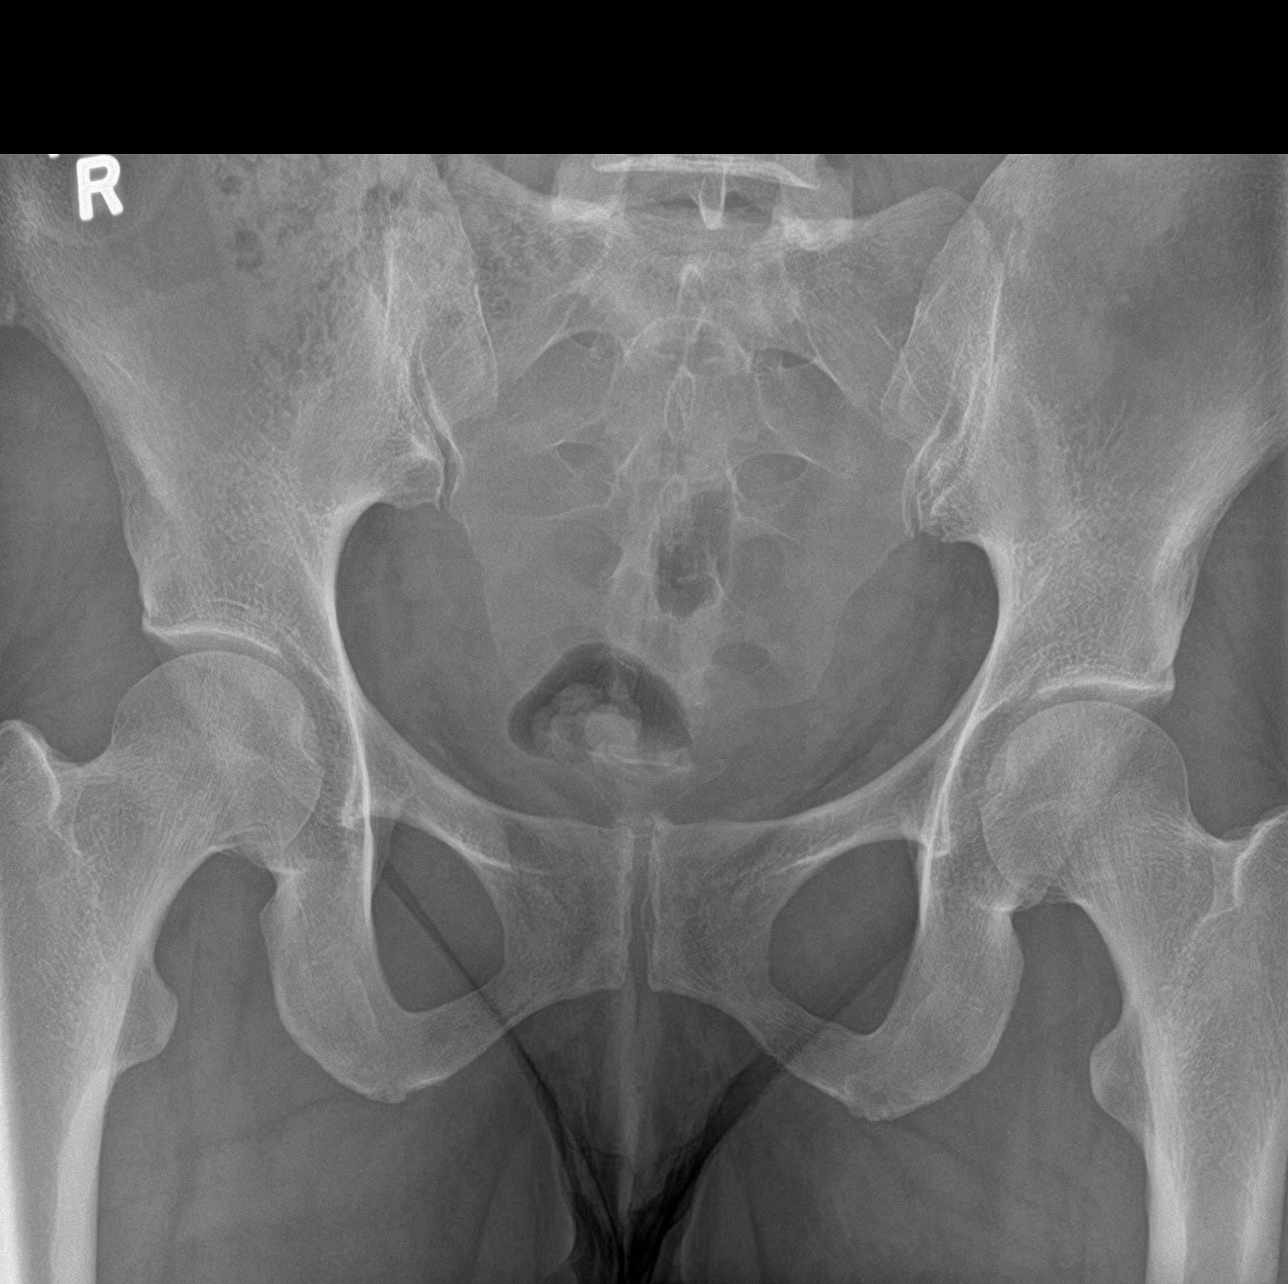

[coccyx ap]
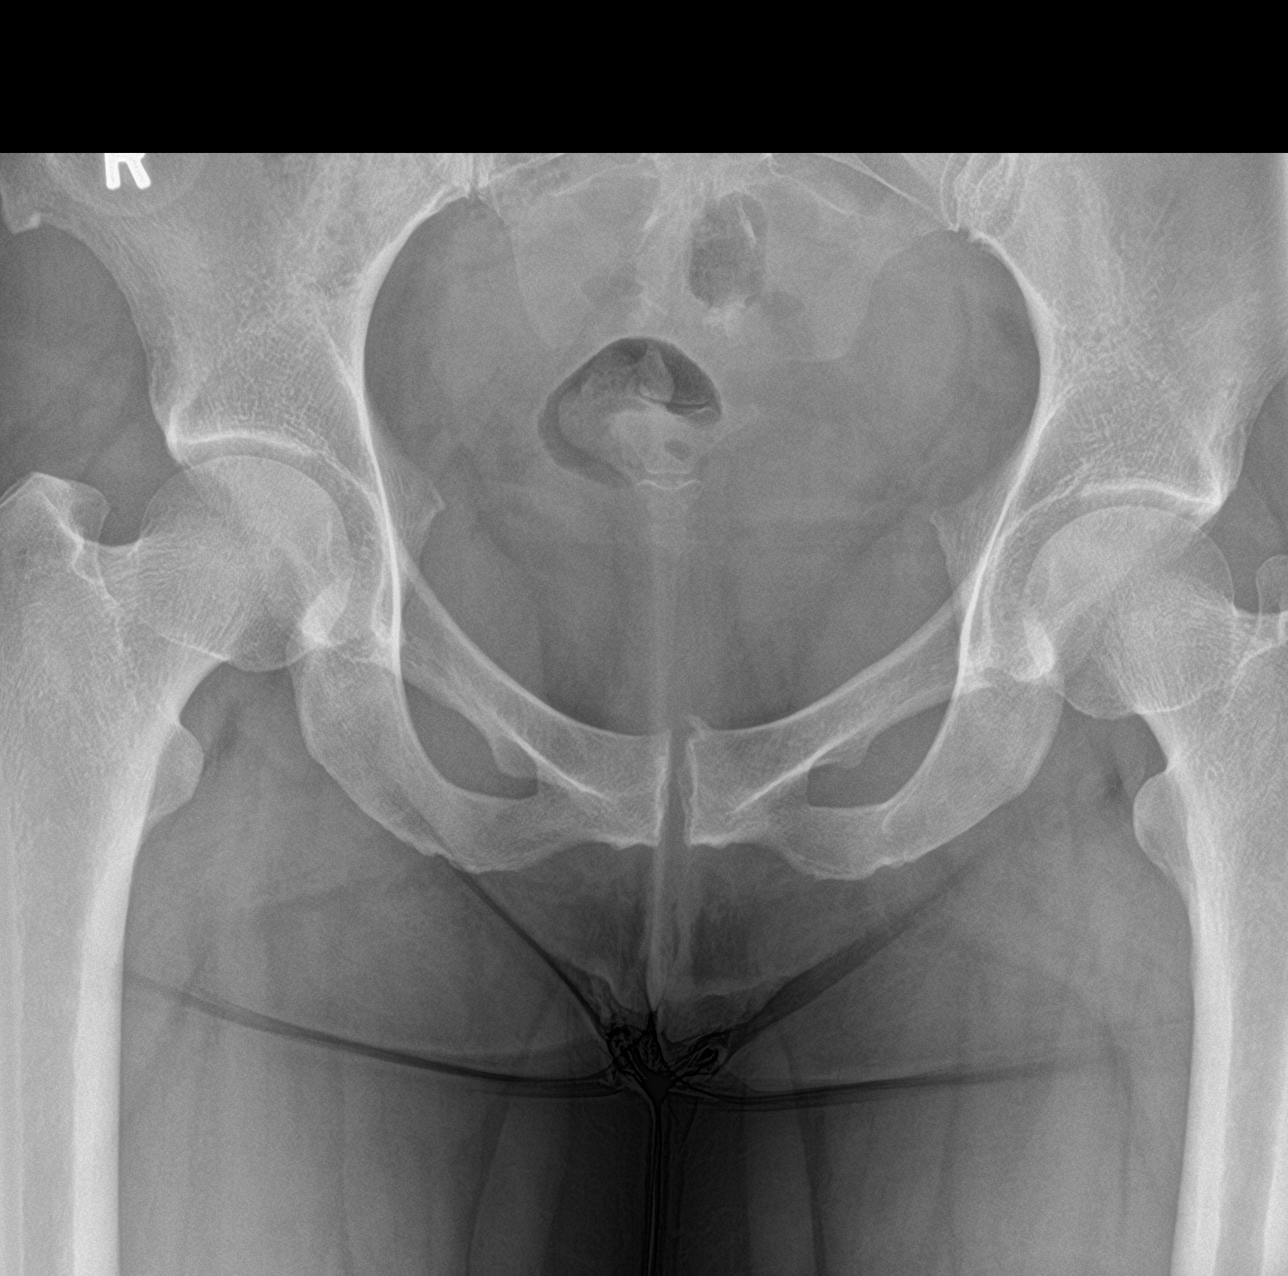

[sacrum lat]
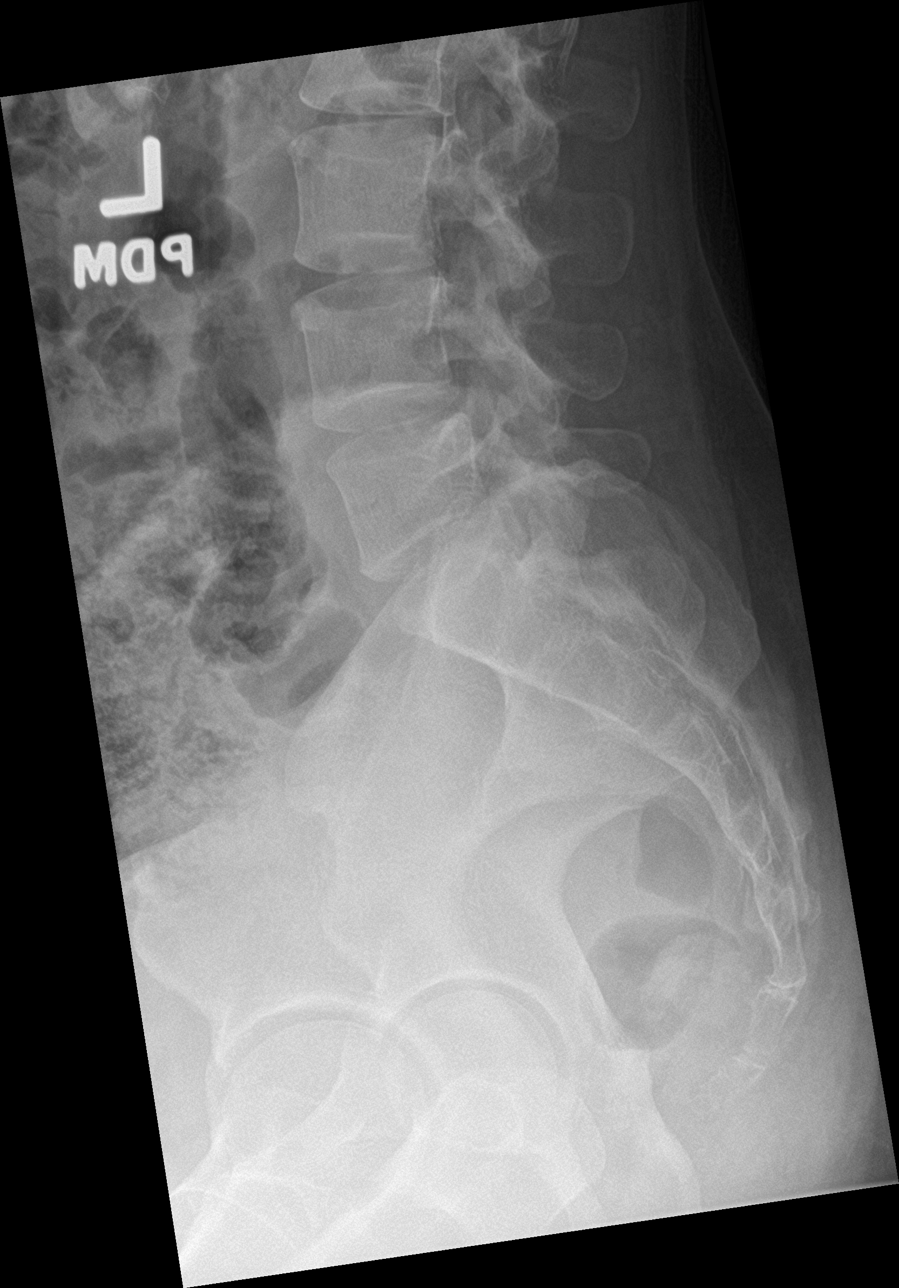

[3 of 3 positions shown; findings below may reference images not displayed]

FINDINGS: There is no evidence of fracture or other focal bone lesions. The
sacroiliac joints are unremarkable. Minimal degenerative changes of
the pubic symphysis. The bilateral hip joint spaces are preserved.
Bone mineralization is normal.
IMPRESSION: No acute osseous abnormality or significant degenerative changes.

## 2021-06-14 ENCOUNTER — Encounter: Payer: Self-pay | Admitting: Neurology

## 2021-06-19 DIAGNOSIS — J301 Allergic rhinitis due to pollen: Secondary | ICD-10-CM | POA: Diagnosis not present

## 2021-06-26 ENCOUNTER — Encounter: Payer: Self-pay | Admitting: Neurology

## 2021-06-26 ENCOUNTER — Ambulatory Visit: Payer: BC Managed Care – PPO | Admitting: Neurology

## 2021-06-26 VITALS — BP 121/77 | HR 91 | Ht 59.0 in | Wt 131.0 lb

## 2021-06-26 DIAGNOSIS — H5789 Other specified disorders of eye and adnexa: Secondary | ICD-10-CM | POA: Diagnosis not present

## 2021-06-26 DIAGNOSIS — R519 Headache, unspecified: Secondary | ICD-10-CM | POA: Diagnosis not present

## 2021-06-26 DIAGNOSIS — R51 Headache with orthostatic component, not elsewhere classified: Secondary | ICD-10-CM

## 2021-06-26 DIAGNOSIS — H5710 Ocular pain, unspecified eye: Secondary | ICD-10-CM | POA: Diagnosis not present

## 2021-06-26 DIAGNOSIS — H0520 Unspecified exophthalmos: Secondary | ICD-10-CM

## 2021-06-26 DIAGNOSIS — G8929 Other chronic pain: Secondary | ICD-10-CM

## 2021-06-26 NOTE — Progress Notes (Signed)
?[facility] ? ? ? ?Provider:  Dr Jordan Lara ?Requesting Provider: Sueanne Margarita, DO ?Primary Care Provider:  Sueanne Margarita, DO ? ?CC:  headaches ? ?Interval history June 26, 2021: Patient here for follow-up of recurrent headaches.  She was here last over a year ago and we tried her on Nurtec, triptans contraindicated due to coronary artery spasm.  She also has a past medical history of CRS with polyps, asthma, chronic pansinusitis, nasal polyposis chronic and recurrent, right otitis media with effusion, and dysfunction of both eustachian tubes.  She was seen May 03, 2021 for chronic pansinusitis, I reviewed ENTs notes, she has undergone prior polypectomy in 2018, helped symptoms for a few months but recurred despite use of topical steroid, she has used Flonase with mild relief none with budesonide irrigations, and multiple other medications for her sinus problems.  Asthma control with Breo and Singulair.  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  Previous endoscopy showed diffuse mucosal edema, maxillary sinuses with mucosal edema and mucin, possible soft tissue obstruction of the left ostium, mucosal edema present in the ethmoids, unable to see into frontals or sphenoids, in the past they started on topical prednisone drops and discussed if not effective trying Xhance nasal spray. At her February 1 appointment this year she also stated she could not hear out of her right ear for months, COVID after Christmas affected sinuses, she had been on Xhance and Astelin as  needed, she had right myringotomy with insertion of tube, straightforward insertion of PE tube in inferior quadrant, and subjective improvement in hearing after tube placement, also showed progression of ethmoid edema, polyposis bilaterally obliterating the sinuses in February 2023, thick mucoid secretions, allergic mucin, continued on Xhance, Astelin, she was back on February 10 for purulent drainage from the right ear at night in February, improved hearing since the tube but purulent drainage, she was started on Floxin drops without change, sharp pain when air entry of the ear, left ear with some pressure, she does have a history of prior sinonasal surgery, repeat CT in July 2022 still showed diffuse mucoperiosteal thickening, opacified intra orbital ethmoid cells, opacified frontal outflow tracts and debris in the left frontal sinus and complete opacification of the right frontal sinus, narrow ethmoid tracts with opacified olfactory clefts and bilateral recess opacification and fluid in sphenoid sinuses.  They started current Ciprodex drops to the right ear for ongoing purulent drainage, the PE tube is patent and in good location, no evidence of left-sided effusion and no indication she needed a left PE tube, avoid decongestions because of history of arrhythmia, continue CRS with polyp management as before.  Electronically signed notes by Linzie Collin MD ? ?She is here alone and reports she has had sinus headaches which are common due to chronic pansinusitis but has not had a migraine or bad headache since last April. She has had eye pain for a year, it is in her right eye, feels like it is behind her eye or when she moves her or squints, feels like the eye is swollen or pain like the eyeball itself, she saw an eye doctor at [place] and the  globe is normal, her right eye feels more forward and bulging forward, she can feel puffiness behind the right eye even feels the lid doesn't close as fast as the left  one. There is something "weird" with ehr right eye, Painful on movement, vision is impaire, Episodic, can last a whole month or a few days, can be painful to the touch and it is the eyeball or something behind it. Not a headache, not migrainous, eye pain. > 1 year. Headaches are positional. Noting seems to help, unclear triggers, just always there. No inciting events. She had head trauma on the left not the right she head bumped her dog. No other focal neurologic deficits, associated symptoms, inciting events or modifiable factors. ? ? ?Viewed her prior medication list, medications tried that can be used in the management of headaches and migraines includes amlodipine/Norvasc, Nurtec, triptans contraindicated, Decadron, Benadryl, Reglan, Zofran, tramadol.  ? ?04/18/2020: MRI brain ws normal ? ?HPI 04/16/2020:  Jordan Lara is a 41 y.o. female here as requested by Jordan Margarita, DO for recurrent headaches.  Past medical history headaches, coronary artery spasm, deviated nasal septum, eczema, asthma, functional diarrhea, anxiety, GERD, IBS, menstrual migraine, mixed hyperlipidemia, chronic recurrent nasal polyps, primary insomnia, allergies, she is never smoked or used smokeless tobacco, 25 standard alcohol drinks a week no drugs. ? ?I reviewed Jordan Senior, PA*'s notes: Patient had a complaint of recurrent headaches, when she was seen on April 02, 2020 she stated it was her third episode, prior states were a result of nasal polyp removal, headache ongoing 3 days, started gradually and worsening over time, pain is mainly over the right side of the face and head, she has pain around the right eye right maxilla states teeth are throbbing, pain radiates into the right side of the head, associated photophobia, intermittent vision problems, she also reports nausea no vomiting, no numbness or weakness in the extremities, no fevers chills no neck pain or stiffness, the last several headaches resolved over time,  she was seen for once before and given a prescription for Vicodin, prednisone taken the day before did not help, no sinus pain or congestion, no history of headaches prior to these last 3. Jordan Senior, PA*'s examination was normal including physical exam, head, eyes, musculoskeletal, skin, neurologic, psychiatric, slightly elevated blood pressure 143/92 with pulse of 110 and BMI 27.7, diagnosed with acute non-intractable headache and referred here, was also treated with Fioricet and a Decadron injection with phenergan.  She was also started on Nurtec, Maxalt and Topamax(was on it 2 weeks with side effects). ? ?She is here alone, she had migraines in the past around menses these are different. These new headaches last a week, way different than prior migraines, they are severe, she "rocks in pain", They started about a year ago, first 2 associated with nasal polyp surgery a day or so later. The last one was a few weeks ago not associated with nasal polyps, she started having a slight headache in the evenings and then it started behind the right sinus and in the upper teeth, hasn't had a scan since 2018, this episode identical to the episodes a year ago except not followed by a visit to the ENT. Starts getting worse behind the maxillary sinus, then right forehead, light sensitivity, moves into the temple and top of the head, vibrating in both ear canal, some blurred vision, right ear pain, it is positional she wakes with it, continuous for a week, sharp pain right back of  the head, tension in her back and chest, blood pressure increases due to the pain, feels like it is pushing her teeth out. She ahs chronic polyps, she was on prednisone last year for the polyps, '60mg'$ , she has very thick mucous.  ? ?Reviewed notes, labs and imaging from outside physicians, which showed: see above ? ?Review of Systems: ?Patient complains of symptoms per HPI as well as the following symptoms: sinus problems. Pertinent negatives  and positives per HPI. All others negative. ? ? ?Social History  ? ?Socioeconomic History  ? Marital status: Married  ?  Spouse name: Not on file  ? Number of children: 0  ? Years of education: Not on file  ?

## 2021-06-26 NOTE — Patient Instructions (Signed)
MRI of the brain and MRI of the orbits ? ? ?

## 2021-07-04 DIAGNOSIS — J301 Allergic rhinitis due to pollen: Secondary | ICD-10-CM | POA: Diagnosis not present

## 2021-07-04 DIAGNOSIS — H6532 Chronic mucoid otitis media, left ear: Secondary | ICD-10-CM | POA: Diagnosis not present

## 2021-07-10 ENCOUNTER — Telehealth: Payer: Self-pay | Admitting: Neurology

## 2021-07-10 DIAGNOSIS — M545 Low back pain, unspecified: Secondary | ICD-10-CM | POA: Diagnosis not present

## 2021-07-10 NOTE — Telephone Encounter (Signed)
LVM for pt to call back to schedule  ?Jordan Lara: [id number] (exp. 07/08/21 to 08/06/21)  ?

## 2021-07-10 NOTE — Telephone Encounter (Signed)
Patient returned my call she is scheduled at [facility] for 07/16/21.  ?

## 2021-07-11 DIAGNOSIS — J338 Other polyp of sinus: Secondary | ICD-10-CM | POA: Diagnosis not present

## 2021-07-11 DIAGNOSIS — J301 Allergic rhinitis due to pollen: Secondary | ICD-10-CM | POA: Diagnosis not present

## 2021-07-16 ENCOUNTER — Ambulatory Visit: Payer: BC Managed Care – PPO

## 2021-07-16 DIAGNOSIS — R519 Headache, unspecified: Secondary | ICD-10-CM

## 2021-07-16 DIAGNOSIS — R51 Headache with orthostatic component, not elsewhere classified: Secondary | ICD-10-CM | POA: Diagnosis not present

## 2021-07-16 DIAGNOSIS — H5789 Other specified disorders of eye and adnexa: Secondary | ICD-10-CM | POA: Diagnosis not present

## 2021-07-16 DIAGNOSIS — H5710 Ocular pain, unspecified eye: Secondary | ICD-10-CM

## 2021-07-16 DIAGNOSIS — H0520 Unspecified exophthalmos: Secondary | ICD-10-CM

## 2021-07-16 DIAGNOSIS — G8929 Other chronic pain: Secondary | ICD-10-CM

## 2021-07-16 MED ORDER — GADOBENATE DIMEGLUMINE 529 MG/ML IV SOLN
10.0000 mL | Freq: Once | INTRAVENOUS | Status: AC | PRN
  Administered 2021-07-16: 10 mL via INTRAVENOUS

## 2021-07-25 DIAGNOSIS — J330 Polyp of nasal cavity: Secondary | ICD-10-CM | POA: Diagnosis not present

## 2021-07-29 ENCOUNTER — Ambulatory Visit: Payer: BC Managed Care – PPO | Admitting: Neurology

## 2021-07-29 DIAGNOSIS — J708 Respiratory conditions due to other specified external agents: Secondary | ICD-10-CM | POA: Diagnosis not present

## 2021-07-29 DIAGNOSIS — J339 Nasal polyp, unspecified: Secondary | ICD-10-CM | POA: Diagnosis not present

## 2021-07-29 DIAGNOSIS — J329 Chronic sinusitis, unspecified: Secondary | ICD-10-CM | POA: Diagnosis not present

## 2021-07-29 DIAGNOSIS — J45909 Unspecified asthma, uncomplicated: Secondary | ICD-10-CM | POA: Diagnosis not present

## 2021-07-29 DIAGNOSIS — J324 Chronic pansinusitis: Secondary | ICD-10-CM | POA: Diagnosis not present

## 2021-08-07 DIAGNOSIS — R87610 Atypical squamous cells of undetermined significance on cytologic smear of cervix (ASC-US): Secondary | ICD-10-CM | POA: Diagnosis not present

## 2021-08-07 DIAGNOSIS — Z6825 Body mass index (BMI) 25.0-25.9, adult: Secondary | ICD-10-CM | POA: Diagnosis not present

## 2021-08-07 DIAGNOSIS — Z01419 Encounter for gynecological examination (general) (routine) without abnormal findings: Secondary | ICD-10-CM | POA: Diagnosis not present

## 2021-08-07 DIAGNOSIS — Z1231 Encounter for screening mammogram for malignant neoplasm of breast: Secondary | ICD-10-CM | POA: Diagnosis not present

## 2021-08-20 DIAGNOSIS — M545 Low back pain, unspecified: Secondary | ICD-10-CM | POA: Diagnosis not present

## 2021-09-17 DIAGNOSIS — T380X5A Adverse effect of glucocorticoids and synthetic analogues, initial encounter: Secondary | ICD-10-CM | POA: Diagnosis not present

## 2021-09-17 DIAGNOSIS — J339 Nasal polyp, unspecified: Secondary | ICD-10-CM | POA: Diagnosis not present

## 2021-09-17 DIAGNOSIS — E559 Vitamin D deficiency, unspecified: Secondary | ICD-10-CM | POA: Diagnosis not present

## 2021-09-17 DIAGNOSIS — M85851 Other specified disorders of bone density and structure, right thigh: Secondary | ICD-10-CM | POA: Diagnosis not present

## 2021-09-17 DIAGNOSIS — M85852 Other specified disorders of bone density and structure, left thigh: Secondary | ICD-10-CM | POA: Diagnosis not present

## 2021-09-20 DIAGNOSIS — M8500 Fibrous dysplasia (monostotic), unspecified site: Secondary | ICD-10-CM | POA: Diagnosis not present

## 2021-09-26 DIAGNOSIS — D2272 Melanocytic nevi of left lower limb, including hip: Secondary | ICD-10-CM | POA: Diagnosis not present

## 2021-09-26 DIAGNOSIS — J301 Allergic rhinitis due to pollen: Secondary | ICD-10-CM | POA: Diagnosis not present

## 2021-09-26 DIAGNOSIS — L821 Other seborrheic keratosis: Secondary | ICD-10-CM | POA: Diagnosis not present

## 2021-09-26 DIAGNOSIS — D225 Melanocytic nevi of trunk: Secondary | ICD-10-CM | POA: Diagnosis not present

## 2021-09-26 DIAGNOSIS — D2271 Melanocytic nevi of right lower limb, including hip: Secondary | ICD-10-CM | POA: Diagnosis not present

## 2021-09-30 DIAGNOSIS — J4550 Severe persistent asthma, uncomplicated: Secondary | ICD-10-CM | POA: Diagnosis not present

## 2021-10-01 DIAGNOSIS — R0602 Shortness of breath: Secondary | ICD-10-CM | POA: Diagnosis not present

## 2021-10-01 DIAGNOSIS — J339 Nasal polyp, unspecified: Secondary | ICD-10-CM | POA: Diagnosis not present

## 2021-10-01 DIAGNOSIS — K219 Gastro-esophageal reflux disease without esophagitis: Secondary | ICD-10-CM | POA: Diagnosis not present

## 2021-10-01 DIAGNOSIS — J324 Chronic pansinusitis: Secondary | ICD-10-CM | POA: Diagnosis not present

## 2021-10-01 DIAGNOSIS — J4530 Mild persistent asthma, uncomplicated: Secondary | ICD-10-CM | POA: Diagnosis not present

## 2021-10-15 DIAGNOSIS — J329 Chronic sinusitis, unspecified: Secondary | ICD-10-CM | POA: Diagnosis not present

## 2021-10-15 DIAGNOSIS — J324 Chronic pansinusitis: Secondary | ICD-10-CM | POA: Diagnosis not present

## 2021-10-15 DIAGNOSIS — J4530 Mild persistent asthma, uncomplicated: Secondary | ICD-10-CM | POA: Diagnosis not present

## 2021-10-15 DIAGNOSIS — J339 Nasal polyp, unspecified: Secondary | ICD-10-CM | POA: Diagnosis not present

## 2021-10-15 DIAGNOSIS — K219 Gastro-esophageal reflux disease without esophagitis: Secondary | ICD-10-CM | POA: Diagnosis not present

## 2021-10-23 DIAGNOSIS — Z886 Allergy status to analgesic agent status: Secondary | ICD-10-CM | POA: Diagnosis not present

## 2021-10-23 DIAGNOSIS — J339 Nasal polyp, unspecified: Secondary | ICD-10-CM | POA: Diagnosis not present

## 2021-10-23 DIAGNOSIS — J324 Chronic pansinusitis: Secondary | ICD-10-CM | POA: Diagnosis not present

## 2021-10-23 DIAGNOSIS — J45909 Unspecified asthma, uncomplicated: Secondary | ICD-10-CM | POA: Diagnosis not present

## 2021-11-13 DIAGNOSIS — J324 Chronic pansinusitis: Secondary | ICD-10-CM | POA: Diagnosis not present

## 2021-11-13 DIAGNOSIS — J301 Allergic rhinitis due to pollen: Secondary | ICD-10-CM | POA: Diagnosis not present

## 2021-11-13 DIAGNOSIS — J339 Nasal polyp, unspecified: Secondary | ICD-10-CM | POA: Diagnosis not present

## 2021-11-13 DIAGNOSIS — Z886 Allergy status to analgesic agent status: Secondary | ICD-10-CM | POA: Diagnosis not present

## 2021-11-15 ENCOUNTER — Telehealth: Payer: Self-pay | Admitting: *Deleted

## 2021-11-15 NOTE — Chronic Care Management (AMB) (Signed)
  Care Coordination  Outreach Note  11/15/2021 Name: Jordan Lara MRN: [id number] DOB: 04-Oct-1980   Care Coordination Outreach Attempts  An unsuccessful telephone outreach was attempted today to offer the patient information about available care coordination services as a benefit of their health plan.   Follow Up Plan:  Additional outreach attempts will be made to offer the patient care coordination information and services.   Encounter Outcome:  No Answer  [place]  Direct Dial: [phone]

## 2021-11-20 NOTE — Chronic Care Management (AMB) (Signed)
  Care Coordination  Outreach Note  11/20/2021 Name: Jordan Lara MRN: [id number] DOB: [birth year]   Care Coordination Outreach Attempts  A second unsuccessful outreach was attempted today to offer the patient with information about available care coordination services as a benefit of their health plan.     Follow Up Plan:  Additional outreach attempts will be made to offer the patient care coordination information and services.   Encounter Outcome:  No Answer   [place]  Direct Dial: [phone]

## 2021-11-26 NOTE — Chronic Care Management (AMB) (Signed)
  Care Coordination  Outreach Note  11/26/2021 Name: Jordan Lara MRN: [id number] DOB: Jun 17, 1980   Care Coordination Outreach Attempts  A third unsuccessful outreach was attempted today to offer the patient with information about available care coordination services as a benefit of their health plan.   Follow Up Plan:  No further outreach attempts will be made at this time. We have been unable to contact the patient to offer or enroll patient in care coordination services  Encounter Outcome:  No Answer   [place]: [phone]

## 2021-12-18 DIAGNOSIS — J4550 Severe persistent asthma, uncomplicated: Secondary | ICD-10-CM | POA: Diagnosis not present

## 2021-12-18 DIAGNOSIS — J339 Nasal polyp, unspecified: Secondary | ICD-10-CM | POA: Diagnosis not present

## 2021-12-18 DIAGNOSIS — M545 Low back pain, unspecified: Secondary | ICD-10-CM | POA: Diagnosis not present

## 2021-12-18 DIAGNOSIS — Z7951 Long term (current) use of inhaled steroids: Secondary | ICD-10-CM | POA: Diagnosis not present

## 2021-12-18 DIAGNOSIS — J329 Chronic sinusitis, unspecified: Secondary | ICD-10-CM | POA: Diagnosis not present

## 2021-12-24 DIAGNOSIS — F419 Anxiety disorder, unspecified: Secondary | ICD-10-CM | POA: Diagnosis not present

## 2021-12-24 DIAGNOSIS — M8580 Other specified disorders of bone density and structure, unspecified site: Secondary | ICD-10-CM | POA: Diagnosis not present

## 2021-12-30 DIAGNOSIS — M25561 Pain in right knee: Secondary | ICD-10-CM | POA: Diagnosis not present

## 2021-12-30 DIAGNOSIS — M25552 Pain in left hip: Secondary | ICD-10-CM | POA: Diagnosis not present

## 2021-12-30 DIAGNOSIS — M25551 Pain in right hip: Secondary | ICD-10-CM | POA: Diagnosis not present

## 2022-01-01 DIAGNOSIS — Z1331 Encounter for screening for depression: Secondary | ICD-10-CM | POA: Diagnosis not present

## 2022-01-01 DIAGNOSIS — Z1389 Encounter for screening for other disorder: Secondary | ICD-10-CM | POA: Diagnosis not present

## 2022-01-01 DIAGNOSIS — E785 Hyperlipidemia, unspecified: Secondary | ICD-10-CM | POA: Diagnosis not present

## 2022-01-01 DIAGNOSIS — Z0000 Encounter for general adult medical examination without abnormal findings: Secondary | ICD-10-CM | POA: Diagnosis not present

## 2022-01-08 DIAGNOSIS — M545 Low back pain, unspecified: Secondary | ICD-10-CM | POA: Diagnosis not present

## 2022-01-13 DIAGNOSIS — J988 Other specified respiratory disorders: Secondary | ICD-10-CM | POA: Diagnosis not present

## 2022-01-13 DIAGNOSIS — R519 Headache, unspecified: Secondary | ICD-10-CM | POA: Diagnosis not present

## 2022-01-13 DIAGNOSIS — Z888 Allergy status to other drugs, medicaments and biological substances status: Secondary | ICD-10-CM | POA: Diagnosis not present

## 2022-01-13 DIAGNOSIS — Z886 Allergy status to analgesic agent status: Secondary | ICD-10-CM | POA: Diagnosis not present

## 2022-01-13 DIAGNOSIS — J301 Allergic rhinitis due to pollen: Secondary | ICD-10-CM | POA: Diagnosis not present

## 2022-01-13 DIAGNOSIS — Z882 Allergy status to sulfonamides status: Secondary | ICD-10-CM | POA: Diagnosis not present

## 2022-01-13 DIAGNOSIS — J302 Other seasonal allergic rhinitis: Secondary | ICD-10-CM | POA: Diagnosis not present

## 2022-01-13 DIAGNOSIS — J324 Chronic pansinusitis: Secondary | ICD-10-CM | POA: Diagnosis not present

## 2022-01-13 DIAGNOSIS — J339 Nasal polyp, unspecified: Secondary | ICD-10-CM | POA: Diagnosis not present

## 2022-01-13 DIAGNOSIS — Z880 Allergy status to penicillin: Secondary | ICD-10-CM | POA: Diagnosis not present

## 2022-01-13 DIAGNOSIS — Z881 Allergy status to other antibiotic agents status: Secondary | ICD-10-CM | POA: Diagnosis not present

## 2022-01-14 DIAGNOSIS — M1711 Unilateral primary osteoarthritis, right knee: Secondary | ICD-10-CM | POA: Diagnosis not present

## 2022-01-21 DIAGNOSIS — M1711 Unilateral primary osteoarthritis, right knee: Secondary | ICD-10-CM | POA: Diagnosis not present

## 2022-01-28 DIAGNOSIS — M1711 Unilateral primary osteoarthritis, right knee: Secondary | ICD-10-CM | POA: Diagnosis not present

## 2022-02-04 DIAGNOSIS — R0781 Pleurodynia: Secondary | ICD-10-CM | POA: Diagnosis not present

## 2022-02-12 DIAGNOSIS — J324 Chronic pansinusitis: Secondary | ICD-10-CM | POA: Diagnosis not present

## 2022-02-12 DIAGNOSIS — J4541 Moderate persistent asthma with (acute) exacerbation: Secondary | ICD-10-CM | POA: Diagnosis not present

## 2022-02-17 ENCOUNTER — Other Ambulatory Visit: Payer: Self-pay

## 2022-02-17 ENCOUNTER — Other Ambulatory Visit: Payer: Self-pay | Admitting: Orthopedic Surgery

## 2022-02-17 DIAGNOSIS — S279XXA Injury of unspecified intrathoracic organ, initial encounter: Secondary | ICD-10-CM

## 2022-02-17 DIAGNOSIS — M8430XA Stress fracture, unspecified site, initial encounter for fracture: Secondary | ICD-10-CM

## 2022-02-18 ENCOUNTER — Ambulatory Visit
Admission: RE | Admit: 2022-02-18 | Discharge: 2022-02-18 | Disposition: A | Discharge status: Home / Self Care | Payer: BC Managed Care – PPO | Source: Ambulatory Visit | Attending: Orthopedic Surgery | Admitting: Orthopedic Surgery

## 2022-02-18 ENCOUNTER — Other Ambulatory Visit: Payer: Self-pay | Admitting: Orthopedic Surgery

## 2022-02-18 DIAGNOSIS — R079 Chest pain, unspecified: Secondary | ICD-10-CM | POA: Diagnosis not present

## 2022-02-18 DIAGNOSIS — M8430XA Stress fracture, unspecified site, initial encounter for fracture: Secondary | ICD-10-CM

## 2022-02-18 DIAGNOSIS — S279XXA Injury of unspecified intrathoracic organ, initial encounter: Secondary | ICD-10-CM

## 2022-02-19 ENCOUNTER — Other Ambulatory Visit: Payer: Self-pay | Admitting: Orthopedic Surgery

## 2022-02-19 DIAGNOSIS — M7989 Other specified soft tissue disorders: Secondary | ICD-10-CM | POA: Diagnosis not present

## 2022-02-19 DIAGNOSIS — R768 Other specified abnormal immunological findings in serum: Secondary | ICD-10-CM | POA: Diagnosis not present

## 2022-02-19 DIAGNOSIS — H9190 Unspecified hearing loss, unspecified ear: Secondary | ICD-10-CM

## 2022-02-19 DIAGNOSIS — M25561 Pain in right knee: Secondary | ICD-10-CM | POA: Diagnosis not present

## 2022-02-21 DIAGNOSIS — U071 COVID-19: Secondary | ICD-10-CM | POA: Diagnosis not present

## 2022-02-21 DIAGNOSIS — R0981 Nasal congestion: Secondary | ICD-10-CM | POA: Diagnosis not present

## 2022-03-31 DIAGNOSIS — J330 Polyp of nasal cavity: Secondary | ICD-10-CM | POA: Diagnosis not present

## 2022-03-31 DIAGNOSIS — Z881 Allergy status to other antibiotic agents status: Secondary | ICD-10-CM | POA: Diagnosis not present

## 2022-03-31 DIAGNOSIS — T485X5A Adverse effect of other anti-common-cold drugs, initial encounter: Secondary | ICD-10-CM | POA: Diagnosis not present

## 2022-03-31 DIAGNOSIS — Z882 Allergy status to sulfonamides status: Secondary | ICD-10-CM | POA: Diagnosis not present

## 2022-03-31 DIAGNOSIS — J301 Allergic rhinitis due to pollen: Secondary | ICD-10-CM | POA: Diagnosis not present

## 2022-03-31 DIAGNOSIS — J310 Chronic rhinitis: Secondary | ICD-10-CM | POA: Diagnosis not present

## 2022-03-31 DIAGNOSIS — J339 Nasal polyp, unspecified: Secondary | ICD-10-CM | POA: Diagnosis not present

## 2022-03-31 DIAGNOSIS — Z886 Allergy status to analgesic agent status: Secondary | ICD-10-CM | POA: Diagnosis not present

## 2022-03-31 DIAGNOSIS — J988 Other specified respiratory disorders: Secondary | ICD-10-CM | POA: Diagnosis not present

## 2022-03-31 DIAGNOSIS — J324 Chronic pansinusitis: Secondary | ICD-10-CM | POA: Diagnosis not present

## 2022-03-31 DIAGNOSIS — Z888 Allergy status to other drugs, medicaments and biological substances status: Secondary | ICD-10-CM | POA: Diagnosis not present

## 2022-03-31 DIAGNOSIS — J329 Chronic sinusitis, unspecified: Secondary | ICD-10-CM | POA: Diagnosis not present

## 2022-03-31 DIAGNOSIS — Z880 Allergy status to penicillin: Secondary | ICD-10-CM | POA: Diagnosis not present

## 2022-05-02 DIAGNOSIS — J324 Chronic pansinusitis: Secondary | ICD-10-CM | POA: Diagnosis not present

## 2022-05-02 DIAGNOSIS — J301 Allergic rhinitis due to pollen: Secondary | ICD-10-CM | POA: Diagnosis not present

## 2022-05-02 DIAGNOSIS — Z882 Allergy status to sulfonamides status: Secondary | ICD-10-CM | POA: Diagnosis not present

## 2022-05-02 DIAGNOSIS — Z880 Allergy status to penicillin: Secondary | ICD-10-CM | POA: Diagnosis not present

## 2022-05-02 DIAGNOSIS — Z886 Allergy status to analgesic agent status: Secondary | ICD-10-CM | POA: Diagnosis not present

## 2022-05-02 DIAGNOSIS — Z888 Allergy status to other drugs, medicaments and biological substances status: Secondary | ICD-10-CM | POA: Diagnosis not present

## 2022-05-02 DIAGNOSIS — J338 Other polyp of sinus: Secondary | ICD-10-CM | POA: Diagnosis not present

## 2022-05-02 DIAGNOSIS — J339 Nasal polyp, unspecified: Secondary | ICD-10-CM | POA: Diagnosis not present

## 2022-05-02 DIAGNOSIS — H6532 Chronic mucoid otitis media, left ear: Secondary | ICD-10-CM | POA: Diagnosis not present

## 2022-05-02 DIAGNOSIS — Z9889 Other specified postprocedural states: Secondary | ICD-10-CM | POA: Diagnosis not present

## 2022-05-02 DIAGNOSIS — Z881 Allergy status to other antibiotic agents status: Secondary | ICD-10-CM | POA: Diagnosis not present

## 2022-06-18 DIAGNOSIS — Z789 Other specified health status: Secondary | ICD-10-CM | POA: Diagnosis not present

## 2022-06-18 DIAGNOSIS — J324 Chronic pansinusitis: Secondary | ICD-10-CM | POA: Diagnosis not present

## 2022-06-18 DIAGNOSIS — J339 Nasal polyp, unspecified: Secondary | ICD-10-CM | POA: Diagnosis not present

## 2022-07-15 DIAGNOSIS — L986 Other infiltrative disorders of the skin and subcutaneous tissue: Secondary | ICD-10-CM | POA: Diagnosis not present

## 2022-07-15 DIAGNOSIS — D485 Neoplasm of uncertain behavior of skin: Secondary | ICD-10-CM | POA: Diagnosis not present

## 2022-07-15 DIAGNOSIS — D489 Neoplasm of uncertain behavior, unspecified: Secondary | ICD-10-CM | POA: Diagnosis not present

## 2022-07-15 DIAGNOSIS — L2089 Other atopic dermatitis: Secondary | ICD-10-CM | POA: Diagnosis not present

## 2022-08-21 DIAGNOSIS — J45901 Unspecified asthma with (acute) exacerbation: Secondary | ICD-10-CM | POA: Diagnosis not present

## 2022-08-21 DIAGNOSIS — Z1152 Encounter for screening for COVID-19: Secondary | ICD-10-CM | POA: Diagnosis not present

## 2022-08-21 DIAGNOSIS — R051 Acute cough: Secondary | ICD-10-CM | POA: Diagnosis not present

## 2022-08-21 DIAGNOSIS — R0981 Nasal congestion: Secondary | ICD-10-CM | POA: Diagnosis not present

## 2022-08-21 DIAGNOSIS — R5383 Other fatigue: Secondary | ICD-10-CM | POA: Diagnosis not present

## 2022-08-21 DIAGNOSIS — J069 Acute upper respiratory infection, unspecified: Secondary | ICD-10-CM | POA: Diagnosis not present

## 2022-09-08 DIAGNOSIS — Z789 Other specified health status: Secondary | ICD-10-CM | POA: Diagnosis not present

## 2022-09-08 DIAGNOSIS — J324 Chronic pansinusitis: Secondary | ICD-10-CM | POA: Diagnosis not present

## 2022-09-08 DIAGNOSIS — J339 Nasal polyp, unspecified: Secondary | ICD-10-CM | POA: Diagnosis not present

## 2022-09-09 DIAGNOSIS — M79604 Pain in right leg: Secondary | ICD-10-CM | POA: Diagnosis not present

## 2022-09-09 DIAGNOSIS — R0789 Other chest pain: Secondary | ICD-10-CM | POA: Diagnosis not present

## 2022-09-16 DIAGNOSIS — I252 Old myocardial infarction: Secondary | ICD-10-CM | POA: Diagnosis not present

## 2022-09-16 DIAGNOSIS — I201 Angina pectoris with documented spasm: Secondary | ICD-10-CM | POA: Diagnosis not present

## 2022-09-16 DIAGNOSIS — J45909 Unspecified asthma, uncomplicated: Secondary | ICD-10-CM | POA: Diagnosis not present

## 2022-09-27 DIAGNOSIS — I201 Angina pectoris with documented spasm: Secondary | ICD-10-CM | POA: Diagnosis not present

## 2022-09-27 DIAGNOSIS — J45909 Unspecified asthma, uncomplicated: Secondary | ICD-10-CM | POA: Diagnosis not present

## 2022-09-27 DIAGNOSIS — I252 Old myocardial infarction: Secondary | ICD-10-CM | POA: Diagnosis not present

## 2022-09-30 DIAGNOSIS — R21 Rash and other nonspecific skin eruption: Secondary | ICD-10-CM | POA: Diagnosis not present

## 2022-09-30 DIAGNOSIS — I201 Angina pectoris with documented spasm: Secondary | ICD-10-CM | POA: Diagnosis not present

## 2022-09-30 DIAGNOSIS — H6692 Otitis media, unspecified, left ear: Secondary | ICD-10-CM | POA: Diagnosis not present

## 2022-09-30 DIAGNOSIS — L986 Other infiltrative disorders of the skin and subcutaneous tissue: Secondary | ICD-10-CM | POA: Diagnosis not present

## 2022-09-30 DIAGNOSIS — J339 Nasal polyp, unspecified: Secondary | ICD-10-CM | POA: Diagnosis not present

## 2022-09-30 DIAGNOSIS — Z79899 Other long term (current) drug therapy: Secondary | ICD-10-CM | POA: Diagnosis not present

## 2022-09-30 DIAGNOSIS — J45909 Unspecified asthma, uncomplicated: Secondary | ICD-10-CM | POA: Diagnosis not present

## 2022-09-30 DIAGNOSIS — M8580 Other specified disorders of bone density and structure, unspecified site: Secondary | ICD-10-CM | POA: Diagnosis not present

## 2022-09-30 DIAGNOSIS — Z7951 Long term (current) use of inhaled steroids: Secondary | ICD-10-CM | POA: Diagnosis not present

## 2022-10-01 DIAGNOSIS — D225 Melanocytic nevi of trunk: Secondary | ICD-10-CM | POA: Diagnosis not present

## 2022-10-01 DIAGNOSIS — D2272 Melanocytic nevi of left lower limb, including hip: Secondary | ICD-10-CM | POA: Diagnosis not present

## 2022-10-01 DIAGNOSIS — D2262 Melanocytic nevi of left upper limb, including shoulder: Secondary | ICD-10-CM | POA: Diagnosis not present

## 2022-10-01 DIAGNOSIS — L814 Other melanin hyperpigmentation: Secondary | ICD-10-CM | POA: Diagnosis not present

## 2022-10-09 DIAGNOSIS — E785 Hyperlipidemia, unspecified: Secondary | ICD-10-CM | POA: Diagnosis not present

## 2022-10-10 DIAGNOSIS — L986 Other infiltrative disorders of the skin and subcutaneous tissue: Secondary | ICD-10-CM | POA: Diagnosis not present

## 2022-10-14 DIAGNOSIS — J4550 Severe persistent asthma, uncomplicated: Secondary | ICD-10-CM | POA: Diagnosis not present

## 2022-10-14 DIAGNOSIS — M9900 Segmental and somatic dysfunction of head region: Secondary | ICD-10-CM | POA: Diagnosis not present

## 2022-10-14 DIAGNOSIS — R0789 Other chest pain: Secondary | ICD-10-CM | POA: Diagnosis not present

## 2022-10-17 DIAGNOSIS — L986 Other infiltrative disorders of the skin and subcutaneous tissue: Secondary | ICD-10-CM | POA: Diagnosis not present

## 2022-10-23 DIAGNOSIS — L986 Other infiltrative disorders of the skin and subcutaneous tissue: Secondary | ICD-10-CM | POA: Diagnosis not present

## 2022-10-29 DIAGNOSIS — L986 Other infiltrative disorders of the skin and subcutaneous tissue: Secondary | ICD-10-CM | POA: Diagnosis not present

## 2022-10-30 DIAGNOSIS — L986 Other infiltrative disorders of the skin and subcutaneous tissue: Secondary | ICD-10-CM | POA: Diagnosis not present

## 2022-11-06 ENCOUNTER — Other Ambulatory Visit: Payer: Self-pay

## 2022-11-06 ENCOUNTER — Observation Stay (HOSPITAL_COMMUNITY)
Admission: EM | Admit: 2022-11-06 | Discharge: 2022-11-07 | Disposition: A | Discharge status: Home / Self Care | Payer: BC Managed Care – PPO | Source: Home / Self Care | Attending: Emergency Medicine | Admitting: Emergency Medicine

## 2022-11-06 ENCOUNTER — Encounter (HOSPITAL_COMMUNITY): Payer: Self-pay | Admitting: Emergency Medicine

## 2022-11-06 ENCOUNTER — Emergency Department (HOSPITAL_COMMUNITY): Payer: BC Managed Care – PPO

## 2022-11-06 DIAGNOSIS — E876 Hypokalemia: Secondary | ICD-10-CM | POA: Insufficient documentation

## 2022-11-06 DIAGNOSIS — D259 Leiomyoma of uterus, unspecified: Secondary | ICD-10-CM | POA: Diagnosis not present

## 2022-11-06 DIAGNOSIS — J4550 Severe persistent asthma, uncomplicated: Secondary | ICD-10-CM | POA: Insufficient documentation

## 2022-11-06 DIAGNOSIS — R110 Nausea: Secondary | ICD-10-CM | POA: Diagnosis not present

## 2022-11-06 DIAGNOSIS — Z79899 Other long term (current) drug therapy: Secondary | ICD-10-CM | POA: Diagnosis not present

## 2022-11-06 DIAGNOSIS — K859 Acute pancreatitis without necrosis or infection, unspecified: Principal | ICD-10-CM | POA: Insufficient documentation

## 2022-11-06 DIAGNOSIS — R10816 Epigastric abdominal tenderness: Secondary | ICD-10-CM | POA: Diagnosis not present

## 2022-11-06 DIAGNOSIS — R1084 Generalized abdominal pain: Secondary | ICD-10-CM | POA: Diagnosis not present

## 2022-11-06 DIAGNOSIS — K429 Umbilical hernia without obstruction or gangrene: Secondary | ICD-10-CM | POA: Diagnosis not present

## 2022-11-06 LAB — CBC WITH DIFFERENTIAL/PLATELET
Abs Immature Granulocytes: 0.01 10*3/uL (ref 0.00–0.07)
Basophils Absolute: 0.1 10*3/uL (ref 0.0–0.1)
Basophils Relative: 1 %
Eosinophils Absolute: 0.7 10*3/uL — ABNORMAL HIGH (ref 0.0–0.5)
Eosinophils Relative: 9 %
HCT: 42.3 % (ref 36.0–46.0)
Hemoglobin: 14 g/dL (ref 12.0–15.0)
Immature Granulocytes: 0 %
Lymphocytes Relative: 34 %
Lymphs Abs: 2.7 10*3/uL (ref 0.7–4.0)
MCH: 28.9 pg (ref 26.0–34.0)
MCHC: 33.1 g/dL (ref 30.0–36.0)
MCV: 87.2 fL (ref 80.0–100.0)
Monocytes Absolute: 0.6 10*3/uL (ref 0.1–1.0)
Monocytes Relative: 8 %
Neutro Abs: 3.9 10*3/uL (ref 1.7–7.7)
Neutrophils Relative %: 48 %
Platelets: 242 10*3/uL (ref 150–400)
RBC: 4.85 MIL/uL (ref 3.87–5.11)
RDW: 13.6 % (ref 11.5–15.5)
WBC: 8 10*3/uL (ref 4.0–10.5)
nRBC: 0 % (ref 0.0–0.2)

## 2022-11-06 LAB — URINALYSIS, ROUTINE W REFLEX MICROSCOPIC
Bacteria, UA: NONE SEEN
Bilirubin Urine: NEGATIVE
Glucose, UA: NEGATIVE mg/dL
Ketones, ur: NEGATIVE mg/dL
Leukocytes,Ua: NEGATIVE
Nitrite: NEGATIVE
Protein, ur: NEGATIVE mg/dL
Specific Gravity, Urine: 1.015 (ref 1.005–1.030)
pH: 6 (ref 5.0–8.0)

## 2022-11-06 LAB — HCG, SERUM, QUALITATIVE: Preg, Serum: NEGATIVE

## 2022-11-06 LAB — COMPREHENSIVE METABOLIC PANEL
ALT: 34 U/L (ref 0–44)
AST: 55 U/L — ABNORMAL HIGH (ref 15–41)
Albumin: 3.8 g/dL (ref 3.5–5.0)
Alkaline Phosphatase: 63 U/L (ref 38–126)
Anion gap: 9 (ref 5–15)
BUN: 14 mg/dL (ref 6–20)
CO2: 22 mmol/L (ref 22–32)
Calcium: 8.9 mg/dL (ref 8.9–10.3)
Chloride: 105 mmol/L (ref 98–111)
Creatinine, Ser: 0.9 mg/dL (ref 0.44–1.00)
GFR, Estimated: >60 mL/min (ref >60)
Glucose, Bld: 110 mg/dL — ABNORMAL HIGH (ref 70–99)
Potassium: 3.3 mmol/L — ABNORMAL LOW (ref 3.5–5.1)
Sodium: 136 mmol/L (ref 135–145)
Total Bilirubin: 0.3 mg/dL (ref 0.3–1.2)
Total Protein: 6.8 g/dL (ref 6.5–8.1)

## 2022-11-06 LAB — LIPASE, BLOOD: Lipase: 567 U/L — ABNORMAL HIGH (ref 11–51)

## 2022-11-06 MED ORDER — IOHEXOL 300 MG/ML  SOLN
100.0000 mL | Freq: Once | INTRAMUSCULAR | Status: AC | PRN
  Administered 2022-11-06: 100 mL via INTRAVENOUS

## 2022-11-06 NOTE — ED Triage Notes (Signed)
Patient presents from home due to upper abdominal pain for the past hour. Roney Jaffe was somewhat sudden in onset and radiates to the chest and back. Patient complains of nausea. EMS administered 100 mcg of fentanyl.    EMS vitals:  146/84 BP 98 HR 0 RR 99 % SPO2 on room air

## 2022-11-07 ENCOUNTER — Observation Stay (HOSPITAL_COMMUNITY): Payer: BC Managed Care – PPO

## 2022-11-07 ENCOUNTER — Encounter (HOSPITAL_COMMUNITY): Payer: Self-pay | Admitting: Internal Medicine

## 2022-11-07 DIAGNOSIS — R7989 Other specified abnormal findings of blood chemistry: Secondary | ICD-10-CM | POA: Diagnosis not present

## 2022-11-07 DIAGNOSIS — K760 Fatty (change of) liver, not elsewhere classified: Secondary | ICD-10-CM

## 2022-11-07 DIAGNOSIS — K859 Acute pancreatitis without necrosis or infection, unspecified: Principal | ICD-10-CM

## 2022-11-07 DIAGNOSIS — E875 Hyperkalemia: Secondary | ICD-10-CM | POA: Diagnosis not present

## 2022-11-07 DIAGNOSIS — E876 Hypokalemia: Secondary | ICD-10-CM

## 2022-11-07 DIAGNOSIS — J4550 Severe persistent asthma, uncomplicated: Secondary | ICD-10-CM

## 2022-11-07 LAB — COMPREHENSIVE METABOLIC PANEL
ALT: 32 U/L (ref 0–44)
AST: 32 U/L (ref 15–41)
Albumin: 3.4 g/dL — ABNORMAL LOW (ref 3.5–5.0)
Alkaline Phosphatase: 61 U/L (ref 38–126)
Anion gap: 9 (ref 5–15)
BUN: 10 mg/dL (ref 6–20)
CO2: 23 mmol/L (ref 22–32)
Calcium: 9.1 mg/dL (ref 8.9–10.3)
Chloride: 105 mmol/L (ref 98–111)
Creatinine, Ser: 0.86 mg/dL (ref 0.44–1.00)
GFR, Estimated: >60 mL/min (ref >60)
Glucose, Bld: 94 mg/dL (ref 70–99)
Potassium: 4 mmol/L (ref 3.5–5.1)
Sodium: 137 mmol/L (ref 135–145)
Total Bilirubin: 0.2 mg/dL — ABNORMAL LOW (ref 0.3–1.2)
Total Protein: 5.9 g/dL — ABNORMAL LOW (ref 6.5–8.1)

## 2022-11-07 LAB — LIPID PANEL
Cholesterol: 171 mg/dL (ref 0–200)
HDL: 85 mg/dL (ref >40)
LDL Cholesterol: 79 mg/dL (ref 0–99)
Total CHOL/HDL Ratio: 2 RATIO
Triglycerides: 34 mg/dL (ref <150)
VLDL: 7 mg/dL (ref 0–40)

## 2022-11-07 LAB — CBC
HCT: 40.7 % (ref 36.0–46.0)
Hemoglobin: 13.1 g/dL (ref 12.0–15.0)
MCH: 28.7 pg (ref 26.0–34.0)
MCHC: 32.2 g/dL (ref 30.0–36.0)
MCV: 89.3 fL (ref 80.0–100.0)
Platelets: 229 10*3/uL (ref 150–400)
RBC: 4.56 MIL/uL (ref 3.87–5.11)
RDW: 14 % (ref 11.5–15.5)
WBC: 5.4 10*3/uL (ref 4.0–10.5)
nRBC: 0 % (ref 0.0–0.2)

## 2022-11-07 LAB — HIV ANTIBODY (ROUTINE TESTING W REFLEX): HIV Screen 4th Generation wRfx: NONREACTIVE

## 2022-11-07 MED ORDER — BISACODYL 5 MG PO TBEC
10.0000 mg | DELAYED_RELEASE_TABLET | Freq: Every day | ORAL | Status: DC
  Filled 2022-11-07: qty 2

## 2022-11-07 MED ORDER — ENOXAPARIN SODIUM 40 MG/0.4ML IJ SOSY: 40.0000 mg | PREFILLED_SYRINGE | INTRAMUSCULAR | Status: DC

## 2022-11-07 MED ORDER — MORPHINE SULFATE (PF) 2 MG/ML IV SOLN: 1.0000 mg | INTRAVENOUS | Status: DC | PRN

## 2022-11-07 MED ORDER — MOMETASONE FURO-FORMOTEROL FUM 200-5 MCG/ACT IN AERO
2.0000 | INHALATION_SPRAY | Freq: Two times a day (BID) | RESPIRATORY_TRACT | Status: DC
  Administered 2022-11-07: 2 via RESPIRATORY_TRACT
  Filled 2022-11-07: qty 8.8

## 2022-11-07 MED ORDER — FAMOTIDINE 20 MG PO TABS
20.0000 mg | ORAL_TABLET | Freq: Every day | ORAL | Status: DC
  Administered 2022-11-07: 20 mg via ORAL
  Filled 2022-11-07: qty 1

## 2022-11-07 MED ORDER — ALBUTEROL SULFATE HFA 108 (90 BASE) MCG/ACT IN AERS: 1.0000 | INHALATION_SPRAY | Freq: Four times a day (QID) | RESPIRATORY_TRACT | Status: DC | PRN

## 2022-11-07 MED ORDER — ALBUTEROL SULFATE (2.5 MG/3ML) 0.083% IN NEBU: 2.5000 mg | INHALATION_SOLUTION | Freq: Four times a day (QID) | RESPIRATORY_TRACT | Status: DC | PRN

## 2022-11-07 MED ORDER — OMEPRAZOLE MAGNESIUM 20 MG PO TBEC
20.0000 mg | DELAYED_RELEASE_TABLET | Freq: Every day | ORAL | 1 refills | Status: AC
Start: 2022-11-07 — End: 2023-11-07

## 2022-11-07 MED ORDER — DIAZEPAM 5 MG PO TABS: 2.5000 mg | ORAL_TABLET | Freq: Four times a day (QID) | ORAL | Status: DC | PRN

## 2022-11-07 MED ORDER — POTASSIUM CHLORIDE 10 MEQ/100ML IV SOLN
10.0000 meq | INTRAVENOUS | Status: AC
  Administered 2022-11-07 (×3): 10 meq via INTRAVENOUS
  Filled 2022-11-07 (×3): qty 100

## 2022-11-07 MED ORDER — BISACODYL 5 MG PO TBEC: 10.0000 mg | DELAYED_RELEASE_TABLET | Freq: Every day | ORAL | 0 refills | Status: AC

## 2022-11-07 MED ORDER — ONDANSETRON HCL 4 MG/2ML IJ SOLN: 4.0000 mg | Freq: Four times a day (QID) | INTRAMUSCULAR | Status: DC | PRN

## 2022-11-07 MED ORDER — ONDANSETRON HCL 4 MG PO TABS: 4.0000 mg | ORAL_TABLET | Freq: Four times a day (QID) | ORAL | 0 refills | Status: DC | PRN

## 2022-11-07 MED ORDER — AMLODIPINE BESYLATE 5 MG PO TABS: 2.5000 mg | ORAL_TABLET | Freq: Two times a day (BID) | ORAL | Status: DC

## 2022-11-07 MED ORDER — LACTATED RINGERS IV SOLN: INTRAVENOUS | Status: AC

## 2022-11-07 MED ORDER — ONDANSETRON HCL 4 MG PO TABS: 4.0000 mg | ORAL_TABLET | Freq: Four times a day (QID) | ORAL | Status: DC | PRN

## 2022-11-07 MED ORDER — AZELASTINE HCL 0.1 % NA SOLN: 1.0000 | Freq: Every day | NASAL | Status: DC | PRN

## 2022-11-07 NOTE — Progress Notes (Signed)
42 year old female admitted with acute pancreatitis.  Patient admitted after midnight.  She was admitted with complaints of abdominal pain nausea after eating home-cooked dinner tomatoes rice and salmon last night. Never had pancreatitis before.  She has had a colonoscopy with Dr. Leone Payor due to history of colon cancer in family members. I went over her CT scan report as well as ultrasound reports with the patient and her husband in the room.  She was very concerned about the fatty liver.  CT abdomen with normal-appearing pancreas and distended stomach with ingested debris.  Bilateral lower lobe bronchial wall thickening with possible mucous plugging.  Fullness of the renal collecting systems bilaterally with no evidence of ureteral calculus.  Findings may be secondary to extrinsic compression from enlarged fibroid uterus.  Moderate amount of retained stool. She will follow-up with her GYN. Appreciate GI input. Her belly was soft and nontender we will start him on clear liquids today. Will order incentive spirometer.  Patient with history of asthma.

## 2022-11-07 NOTE — Discharge Summary (Signed)
Physician Discharge Summary  Jordan Lara DGU:[id number] DOB: [birth year] DOA: 11/06/2022  PCP: Charlane Ferretti, DO  Admit date: 11/06/2022 Discharge date: 11/07/2022  Admitted From: Home Disposition: Home  Recommendations for Outpatient Follow-up:  Follow up with PCP in 1-2 weeks Please obtain BMP/CBC in one week Please follow up with Dr. Leone Payor  Home Health: None Equipment/Devices: None Discharge Condition: Stable to CODE STATUS: Full code Diet recommendation: Cardiac  Brief/Interim Summary:   42 y.o. female with medical history significant for severe persistent asthma, history of coronary artery spasm, chronic recurrent sinusitis who presented to the ED for evaluation of severe epigastric abdominal pain.   Patient states she developed sudden onset severe epigastric abdominal pain around 6:30 PM on 8/15.  This was shortly after dinner.  She had developed associated nausea but did not vomit.  Pain was 8-9/10 in severity.  She called 911 and EMS brought her to the ED.  She was given fentanyl en route with improvement in her abdominal pain.   Patient states she has had two similar but milder episodes remotely, first about 10 years ago and the second a few years ago.   Patient states that she was previously on Dupixent for her asthma/allergies but this was discontinued in April due to concern about side effects potentially causing cutaneous non-Hodgkin's lymphoma.  She also reports a history of aspirin exacerbated respiratory disease and does not take NSAIDs.  She has not had any other significant medication changes.   She reports occasional alcohol use on the weekends, last drink was about 5 days ago.   ED Course  Labs/Imaging on admission: I have personally reviewed following labs and imaging studies.   Initial vitals showed BP 120/78, pulse 99, RR 16, temp 98.3 F, SpO2 99% on room air.   Labs show lipase 567, sodium 136, potassium 3.3, bicarb 22, BUN 14, creatinine 0.90, serum  glucose 110, AST 55, ALT 34, alk phos 63, total bilirubin 0.3, WBC 8.0, hemoglobin 14.0, platelets 242,000, serum hCG negative.   CT abdomen/pelvis with contrast showed normal-appearing pancreas.  Distended stomach with ingested debris, bilateral lower lobe bronchial wall thickening, fullness of the renal collecting systems, and moderate amount of retained stool noted.  Discharge Diagnoses:  Principal Problem:   Acute pancreatitis Active Problems:   Severe persistent asthma   Hypokalemia  Acute pancreatitis: This is her first episode of pancreatitis.  The cause of pancreatitis is unclear.  Lipid panel is essentially normal.  Right upper quadrant ultrasound is essentially normal to with no acute findings.  She will follow-up with Dr. Leone Payor as an outpatient.  She was treated with IV fluids n.p.o. status.  She tolerated it full liquid diet prior to discharge.  She had elevated lipase though CT of the abdomen and pelvis done in the ER did not reveal any new changes  to the pancreas.   Severe persistent asthma: Encouraged her to use incentive spirometer and Mucinex due to findings of the CT scan with mucous plugging in the lower airways. Continued Symbicort and albuterol as needed.   History of coronary artery spasm: Documented by heart cath April 2018.  Follows with [facility] cardiology.  Continue amlodipine.   Hypokalemia: Resolved.  Abnormal CT abdomen with distended stomach with ingested debris she will follow-up with gastroenterologist.  The ultrasound also showed hepatic steatosis.  She is already on a Mediterranean diet.  She will follow-up with the GI doctor for further recommendations.   Estimated body mass index is 27.47 kg/m as calculated from  the following:   Height as of this encounter: 4\' 11"  (1.499 m).   Weight as of this encounter: 61.7 kg.  Discharge Instructions  Discharge Instructions     Diet - low sodium heart healthy   Complete by: As directed    Increase activity  slowly   Complete by: As directed       Allergies as of 11/07/2022       Reactions   Ansaid [flurbiprofen] Anaphylaxis   Swelling, hives, SOB   Aspirin Hives, Shortness Of Breath, Cough   Nsaids Anaphylaxis, Hives, Swelling   ALL NSAIDS   Azithromycin Other (See Comments)   Red splotches on skin   Bee Venom Swelling   As a child   Sulfonamide Derivatives Other (See Comments)   Childhood allergy        Medication List     STOP taking these medications    amLODipine 2.5 MG tablet Commonly known as: NORVASC   Elderberry 575 MG/5ML Syrp       TAKE these medications    albuterol 108 (90 Base) MCG/ACT inhaler Commonly known as: VENTOLIN HFA Inhale 1-2 puffs into the lungs every 6 (six) hours as needed for wheezing or shortness of breath.   Asmanex HFA 100 MCG/ACT Aero Generic drug: Mometasone Furoate Inhale 2 puffs into the lungs 2 (two) times daily.   Azelastine HCl 137 MCG/SPRAY Soln Place 1 spray into both nostrils as needed (allergies).   bisacodyl 5 MG EC tablet Commonly known as: DULCOLAX Take 2 tablets (10 mg total) by mouth daily for 7 days. Start taking on: November 08, 2022   cetirizine 10 MG tablet Commonly known as: ZYRTEC Take 10 mg by mouth daily as needed for allergies.   diazepam 5 MG tablet Commonly known as: VALIUM Take 2.5-5 mg by mouth every 6 (six) hours as needed for anxiety.   diphenhydrAMINE 25 MG tablet Commonly known as: BENADRYL Take 50 mg by mouth every 6 (six) hours as needed for itching or allergies.   EPINEPHrine 0.3 mg/0.3 mL Soaj injection Commonly known as: EPI-PEN Inject 0.3 mg into the muscle once.   fluticasone 50 MCG/ACT nasal spray Commonly known as: FLONASE Place 2 sprays into both nostrils daily as needed.   montelukast 10 MG tablet Commonly known as: SINGULAIR TAKE 1 TABLET (10 MG TOTAL) BY MOUTH AT BEDTIME.   omeprazole 20 MG tablet Commonly known as: PriLOSEC OTC Take 1 tablet (20 mg total) by mouth  daily.   ondansetron 4 MG tablet Commonly known as: ZOFRAN Take 1 tablet (4 mg total) by mouth every 6 (six) hours as needed for nausea.   Symbicort 160-4.5 MCG/ACT inhaler Generic drug: budesonide-formoterol Inhale 2 puffs into the lungs in the morning and at bedtime.        Follow-up Information     Iva Boop, MD Follow up on 01/14/2023.   Specialty: Gastroenterology Why: at 8:50 am. Please arrive 10 minutes early Contact information: [address] [place] [address] [phone]         Charlane Ferretti, DO Follow up.   Specialty: Internal Medicine Contact information: [address] [place] [address] [phone]                Allergies  Allergen Reactions   Ansaid [Flurbiprofen] Anaphylaxis    Swelling, hives, SOB   Aspirin Hives, Shortness Of Breath and Cough   Nsaids Anaphylaxis, Hives and Swelling    ALL NSAIDS   Azithromycin Other (See Comments)  Red splotches on skin   Bee Venom Swelling    As a child   Sulfonamide Derivatives Other (See Comments)    Childhood allergy    Consultations:gi  Procedures/Studies: US Abdomen Limited RUQ (LIVER/GB)  Result Date: 11/07/2022 CLINICAL DATA:  Elevated LFTs. EXAM: ULTRASOUND ABDOMEN LIMITED RIGHT UPPER QUADRANT COMPARISON:  11/06/2022. FINDINGS: Gallbladder: No gallstones or wall thickening visualized. No sonographic Murphy sign noted by sonographer. Common bile duct: Diameter: 2.7 mm Liver: No focal lesion identified. Increased parenchymal echogenicity. Portal vein is patent on color Doppler imaging with normal direction of blood flow towards the liver. Other: No free fluid. IMPRESSION: 1. No cholelithiasis or acute cholecystitis. 2. Hepatic steatosis. Electronically Signed   By: Thornell Sartorius M.D.   On: 11/07/2022 01:25   CT ABDOMEN PELVIS W CONTRAST  Result Date: 11/06/2022 CLINICAL DATA:  Pancreatitis, acute, severe. Abdominal pain radiating to chest and back. Nausea. EXAM: CT ABDOMEN  AND PELVIS WITH CONTRAST TECHNIQUE: Multidetector CT imaging of the abdomen and pelvis was performed using the standard protocol following bolus administration of intravenous contrast. RADIATION DOSE REDUCTION: This exam was performed according to the departmental dose-optimization program which includes automated exposure control, adjustment of the mA and/or kV according to patient size and/or use of iterative reconstruction technique. CONTRAST:  OMNIPAQUE IOHEXOL 300 MG/ML  SOLN COMPARISON:  None Available. FINDINGS: Lower chest: Bronchial wall thickening with possible mucous plugging is noted in the lower lobes bilaterally. Hepatobiliary: No focal liver abnormality is seen. No gallstones, gallbladder wall thickening, or biliary dilatation. Pancreas: Unremarkable. No pancreatic ductal dilatation or surrounding inflammatory changes. Spleen: Normal size. Subcentimeter hypodensities are present in the spleen which are too small to further characterize. Adrenals/Urinary Tract: No adrenal nodule or mass. The kidneys enhance symmetrically. No renal calculus bilaterally there is mild prominence of the renal pelvis and ureters bilaterally without evidence of obstructing calculus and may be secondary to extrinsic compression from enlarged uterus. The bladder is unremarkable. Stomach/Bowel: Distended stomach with ingested debris. Appendix appears normal. No evidence of bowel wall thickening, distention, or inflammatory changes. No free air or pneumatosis. A moderate amount of retained stool is present in the transverse colon and ascending colon. Vascular/Lymphatic: No significant vascular findings are present. No enlarged abdominal or pelvic lymph nodes. Reproductive: The uterus is enlarged with multiple fibroids. No adnexal mass. Other: A trace amount of free fluid is noted in the cul-de-sac and may be physiologic. There is a small fat containing umbilical hernia. Musculoskeletal: Mild degenerative changes in the  lumbar spine. No acute osseous abnormality. IMPRESSION: 1. Normal pancreas. 2. Distended stomach with ingested debris. 3. Bilateral lower lobe bronchial wall thickening with possible mucous plugging. 4. Fullness of the renal collecting systems bilaterally with no evidence of ureteral calculus. Findings may be secondary to extrinsic compression from enlarged fibroid uterus. 5. Moderate amount of retained stool in the ascending and transverse colon, possible constipation. Electronically Signed   By: Thornell Sartorius M.D.   On: 11/06/2022 22:48   (Echo, Carotid, EGD, Colonoscopy, ERCP)    Subjective: Anxious to go home tolerated full liquid  Discharge Exam: Vitals:   11/07/22 0953 11/07/22 1408  BP: 112/70 108/67  Pulse: 79 79  Resp: 16 16  Temp: (!) 97.5 F (36.4 C) 98.3 F (36.8 C)  SpO2: 97% 97%   Vitals:   11/07/22 0543 11/07/22 0745 11/07/22 0953 11/07/22 1408  BP: 113/68  112/70 108/67  Pulse: 67  79 79  Resp: 17  16 16   Temp: 97.8  F (36.6 C)  (!) 97.5 F (36.4 C) 98.3 F (36.8 C)  TempSrc: Oral     SpO2: 97% 98% 97% 97%  Weight:      Height:        General: Pt is alert, awake, not in acute distress Cardiovascular: RRR, S1/S2 +, no rubs, no gallops Respiratory: CTA bilaterally, no wheezing, no rhonchi Abdominal: Soft, NT, ND, bowel sounds + bowel sounds present Extremities: no edema, no cyanosis    The results of significant diagnostics from this hospitalization (including imaging, microbiology, ancillary and laboratory) are listed below for reference.     Microbiology: No results found for this or any previous visit (from the past 240 hour(s)).   Labs: BNP (last 3 results) No results for input(s): "BNP" in the last 8760 hours. Basic Metabolic Panel: Recent Labs  Lab 11/06/22 2009 11/07/22 0504  NA 136 137  K 3.3* 4.0  CL 105 105  CO2 22 23  GLUCOSE 110* 94  BUN 14 10  CREATININE 0.90 0.86  CALCIUM 8.9 9.1   Liver Function Tests: Recent Labs  Lab  11/06/22 2009 11/07/22 0504  AST 55* 32  ALT 34 32  ALKPHOS 63 61  BILITOT 0.3 0.2*  PROT 6.8 5.9*  ALBUMIN 3.8 3.4*   Recent Labs  Lab 11/06/22 2009  LIPASE 567*   No results for input(s): "AMMONIA" in the last 168 hours. CBC: Recent Labs  Lab 11/06/22 2009 11/07/22 0504  WBC 8.0 5.4  NEUTROABS 3.9  --   HGB 14.0 13.1  HCT 42.3 40.7  MCV 87.2 89.3  PLT 242 229   Cardiac Enzymes: No results for input(s): "CKTOTAL", "CKMB", "CKMBINDEX", "TROPONINI" in the last 168 hours. BNP: Invalid input(s): "POCBNP" CBG: No results for input(s): "GLUCAP" in the last 168 hours. D-Dimer No results for input(s): "DDIMER" in the last 72 hours. Hgb A1c No results for input(s): "HGBA1C" in the last 72 hours. Lipid Profile Recent Labs    11/07/22 0504  CHOL 171  HDL 85  LDLCALC 79  TRIG 34  CHOLHDL 2.0   Thyroid function studies No results for input(s): "TSH", "T4TOTAL", "T3FREE", "THYROIDAB" in the last 72 hours.  Invalid input(s): "FREET3" Anemia work up No results for input(s): "VITAMINB12", "FOLATE", "FERRITIN", "TIBC", "IRON", "RETICCTPCT" in the last 72 hours. Urinalysis    Component Value Date/Time   COLORURINE YELLOW 11/06/2022 2007   APPEARANCEUR CLEAR 11/06/2022 2007   LABSPEC 1.015 11/06/2022 2007   PHURINE 6.0 11/06/2022 2007   GLUCOSEU NEGATIVE 11/06/2022 2007   HGBUR MODERATE (A) 11/06/2022 2007   BILIRUBINUR NEGATIVE 11/06/2022 2007   KETONESUR NEGATIVE 11/06/2022 2007   PROTEINUR NEGATIVE 11/06/2022 2007   NITRITE NEGATIVE 11/06/2022 2007   LEUKOCYTESUR NEGATIVE 11/06/2022 2007   Sepsis Labs Recent Labs  Lab 11/06/22 2009 11/07/22 0504  WBC 8.0 5.4   Microbiology No results found for this or any previous visit (from the past 240 hour(s)).   Time coordinating discharge: 39 minutes  SIGNED  Alwyn Ren, MD  [facility] 11/07/2022, 4:12 PM

## 2022-11-07 NOTE — Progress Notes (Signed)
   11/07/22 1529  TOC Brief Assessment  Insurance and Status Reviewed  Patient has primary care physician Yes  Home environment has been reviewed Home  Prior level of function: Independent  Prior/Current Home Services No current home services  Social Determinants of Health Reivew SDOH reviewed no interventions necessary  Readmission risk has been reviewed Yes  Transition of care needs no transition of care needs at this time

## 2022-11-07 NOTE — ED Provider Notes (Addendum)
[place] EMERGENCY DEPARTMENT AT [facility] Provider Note   CSN: [id number] Arrival date & time: 11/06/22  1934     History  Chief Complaint  Patient presents with   Abdominal Pain    Jordan Lara is a 42 y.o. female.  HPI   Patient with medical history including asthma, IBS, hyperlipidemia, coronary spasms, presenting with complaints of epigastric tenderness.  Patient states it started suddenly after dinner, she has a sharp pain which radiated to her back, associated nausea and vomiting, states she had associate chest pain shortness of breath, pain was last stabbing or tearing like sensation, she has no history of dissection or aneurysms, no history of Marfan's disease.  She has no history of PEs, not on oral birth control, no recent surgeries no long immobilization.  She denies any abdominal history, patient denies NSAID use, excessive alcohol use, no new medication changes, never had pancreatitis, she denies any urinary symptoms.  She said that she currently feels better after fentanyl.    Home Medications Prior to Admission medications   Medication Sig Start Date End Date Taking? Authorizing Provider  albuterol (PROVENTIL HFA;VENTOLIN HFA) 108 (90 Base) MCG/ACT inhaler Inhale 1-2 puffs into the lungs every 6 (six) hours as needed for wheezing or shortness of breath.   Yes [provider]  amLODipine (NORVASC) 2.5 MG tablet Take 2.5 mg by mouth 2 (two) times daily.   Yes [provider]  ASMANEX HFA 100 MCG/ACT AERO Inhale 2 puffs into the lungs 2 (two) times daily.   Yes [provider]  Azelastine HCl 137 MCG/SPRAY SOLN Place 1 spray into both nostrils as needed (allergies). 10/03/20  Yes [provider]  cetirizine (ZYRTEC) 10 MG tablet Take 10 mg by mouth daily as needed for allergies.   Yes [provider]  diazepam (VALIUM) 5 MG tablet Take 2.5-5 mg by mouth every 6 (six) hours as needed for anxiety.   Yes [provider]  diphenhydrAMINE (BENADRYL) 25 MG tablet Take 50 mg by mouth every 6 (six) hours as needed for itching or allergies.    Yes [provider]  Elderberry 575 MG/5ML SYRP 1 teaspoon daily   Yes [provider]  EPINEPHrine 0.3 mg/0.3 mL IJ SOAJ injection Inject 0.3 mg into the muscle once.   Yes [provider]  fluticasone (FLONASE) 50 MCG/ACT nasal spray Place 2 sprays into both nostrils daily as needed.    Yes [provider]  SYMBICORT 160-4.5 MCG/ACT inhaler Inhale 2 puffs into the lungs in the morning and at bedtime.   Yes [provider]  montelukast (SINGULAIR) 10 MG tablet TAKE 1 TABLET (10 MG TOTAL) BY MOUTH AT BEDTIME. Patient not taking: Reported on 11/07/2022 05/02/16   Coralyn Helling, MD      Allergies    Ansaid [flurbiprofen], Aspirin, Nsaids, Azithromycin, Bee venom, and Sulfonamide derivatives    Review of Systems   Review of Systems  Constitutional:  Negative for chills and fever.  Respiratory:  Negative for shortness of breath.   Cardiovascular:  Negative for chest pain.  Gastrointestinal:  Negative for abdominal pain.  Neurological:  Negative for headaches.    Physical Exam Updated Vital Signs BP 107/63   Pulse 75   Temp 98.3 F (36.8 C) (Oral)   Resp 19   Ht 4\' 11"  (1.499 m)   Wt 59 kg   SpO2 95%   BMI 26.26 kg/m  Physical Exam Vitals and nursing note reviewed.  Constitutional:  General: She is not in acute distress.    Appearance: She is not ill-appearing.  HENT:     Head: Normocephalic and atraumatic.     Nose: No congestion.  Eyes:     Conjunctiva/sclera: Conjunctivae normal.  Cardiovascular:     Rate and Rhythm: Normal rate and regular rhythm.     Pulses: Normal pulses.     Heart sounds: No murmur heard.    No friction rub. No gallop.  Pulmonary:     Effort: No respiratory distress.     Breath sounds: No wheezing, rhonchi or rales.  Abdominal:     Palpations: Abdomen is soft.      Tenderness: There is abdominal tenderness. There is no right CVA tenderness or left CVA tenderness.     Comments: Abdomen nondistended, soft, very minimal tenderness epigastric region, no guarding rebound tenderness or peritoneal sign.  Musculoskeletal:     Right lower leg: No edema.     Left lower leg: No edema.     Comments: There is no unilateral leg swelling no calf tenderness no palpable cords.  Skin:    General: Skin is warm and dry.  Neurological:     Mental Status: She is alert.  Psychiatric:        Mood and Affect: Mood normal.     ED Results / Procedures / Treatments   Labs (all labs ordered are listed, but only abnormal results are displayed) Labs Reviewed  LIPASE, BLOOD - Abnormal; Notable for the following components:      Result Value   Lipase 567 (*)    All other components within normal limits  COMPREHENSIVE METABOLIC PANEL - Abnormal; Notable for the following components:   Potassium 3.3 (*)    Glucose, Bld 110 (*)    AST 55 (*)    All other components within normal limits  URINALYSIS, ROUTINE W REFLEX MICROSCOPIC - Abnormal; Notable for the following components:   Hgb urine dipstick MODERATE (*)    All other components within normal limits  CBC WITH DIFFERENTIAL/PLATELET - Abnormal; Notable for the following components:   Eosinophils Absolute 0.7 (*)    All other components within normal limits  HCG, SERUM, QUALITATIVE  HIV ANTIBODY (ROUTINE TESTING W REFLEX)  LIPID PANEL  COMPREHENSIVE METABOLIC PANEL  CBC    EKG EKG Interpretation Date/Time:  Thursday November 06 2022 23:31:49 EDT Ventricular Rate:  76 PR Interval:  180 QRS Duration:  83 QT Interval:  362 QTC Calculation: 407 R Axis:   60  Text Interpretation: Sinus rhythm Confirmed by Palumbo, April ([id number]) on 11/06/2022 11:47:29 PM  Radiology CT ABDOMEN PELVIS W CONTRAST  Result Date: 11/06/2022 CLINICAL DATA:  Pancreatitis, acute, severe. Abdominal pain radiating to chest and back. Nausea.  EXAM: CT ABDOMEN AND PELVIS WITH CONTRAST TECHNIQUE: Multidetector CT imaging of the abdomen and pelvis was performed using the standard protocol following bolus administration of intravenous contrast. RADIATION DOSE REDUCTION: This exam was performed according to the departmental dose-optimization program which includes automated exposure control, adjustment of the mA and/or kV according to patient size and/or use of iterative reconstruction technique. CONTRAST:  OMNIPAQUE IOHEXOL 300 MG/ML  SOLN COMPARISON:  None Available. FINDINGS: Lower chest: Bronchial wall thickening with possible mucous plugging is noted in the lower lobes bilaterally. Hepatobiliary: No focal liver abnormality is seen. No gallstones, gallbladder wall thickening, or biliary dilatation. Pancreas: Unremarkable. No pancreatic ductal dilatation or surrounding inflammatory changes. Spleen: Normal size. Subcentimeter hypodensities are present in the spleen which are  too small to further characterize. Adrenals/Urinary Tract: No adrenal nodule or mass. The kidneys enhance symmetrically. No renal calculus bilaterally there is mild prominence of the renal pelvis and ureters bilaterally without evidence of obstructing calculus and may be secondary to extrinsic compression from enlarged uterus. The bladder is unremarkable. Stomach/Bowel: Distended stomach with ingested debris. Appendix appears normal. No evidence of bowel wall thickening, distention, or inflammatory changes. No free air or pneumatosis. A moderate amount of retained stool is present in the transverse colon and ascending colon. Vascular/Lymphatic: No significant vascular findings are present. No enlarged abdominal or pelvic lymph nodes. Reproductive: The uterus is enlarged with multiple fibroids. No adnexal mass. Other: A trace amount of free fluid is noted in the cul-de-sac and may be physiologic. There is a small fat containing umbilical hernia. Musculoskeletal: Mild degenerative  changes in the lumbar spine. No acute osseous abnormality. IMPRESSION: 1. Normal pancreas. 2. Distended stomach with ingested debris. 3. Bilateral lower lobe bronchial wall thickening with possible mucous plugging. 4. Fullness of the renal collecting systems bilaterally with no evidence of ureteral calculus. Findings may be secondary to extrinsic compression from enlarged fibroid uterus. 5. Moderate amount of retained stool in the ascending and transverse colon, possible constipation. Electronically Signed   By: Thornell Sartorius M.D.   On: 11/06/2022 22:48    Procedures Procedures    Medications Ordered in ED Medications  enoxaparin (LOVENOX) injection 40 mg (has no administration in time range)  lactated ringers infusion (has no administration in time range)  morphine (PF) 2 MG/ML injection 1 mg (has no administration in time range)  ondansetron (ZOFRAN) tablet 4 mg (has no administration in time range)    Or  ondansetron (ZOFRAN) injection 4 mg (has no administration in time range)  iohexol (OMNIPAQUE) 300 MG/ML solution 100 mL (100 mLs Intravenous Contrast Given 11/06/22 2206)    ED Course/ Medical Decision Making/ A&P                                 Medical Decision Making Amount and/or Complexity of Data Reviewed Labs: ordered.  Risk Decision regarding hospitalization.   This patient presents to the ED for concern of epigastric pain, this involves an extensive number of treatment options, and is a complaint that carries with it a high risk of complications and morbidity.  The differential diagnosis includes dissection, aneurysm, ACS, PE, pancreatitis, cholecystitis,    Additional history obtained:  Additional history obtained from N/A External records from outside source obtained and reviewed including cardiology notes   Co morbidities that complicate the patient evaluation  Coronary spasms  Social Determinants of Health:  N/A    Lab Tests:  I Ordered, and  personally interpreted labs.  The pertinent results include: CBC unremarkable, CMP reveals potassium 3.3 glucose 110, AST 55, lipase elevated 567, hCG negative, UA shows red blood cells,   Imaging Studies ordered:  I ordered imaging studies including CT abdomen pelvis I independently visualized and interpreted imaging which showed negative for acute findings I agree with the radiologist interpretation   Cardiac Monitoring:  The patient was maintained on a cardiac monitor.  I personally viewed and interpreted the cardiac monitored which showed an underlying rhythm of: EKG without signs of ischemia   Medicines ordered and prescription drug management:  I ordered medication including n/a I have reviewed the patients home medicines and have made adjustments as needed  Critical Interventions:  N/a  Reevaluation:  Presents with epigastric tenderness with since resolved, triage obtain basic lab workup imaging which I personally reviewed, presentation lab work is consistent with acute pancreatitis, shared decision making in regards to admission for further evaluation observation patient was in agreement with this plan will admit to medicine  Consultations Obtained:  I requested consultation with the Dr. Allena Katz,  and discussed lab and imaging findings as well as pertinent plan - they recommend: Will admit the patient.    Test Considered:  N/a    Rule out I have low suspicion for ACS as history is atypical, EKG was sinus rhythm without signs of ischemia, presentation is more consistent with acute pancreatitis as she has an elevated lipase.  Low suspicion for PE as patient denies pleuritic chest pain, shortness of breath, patient denies leg pain, no pedal edema noted on exam, patient was PERC negative.  Low suspicion for AAA or aortic dissection as history is atypical, patient has low risk factors.  Low suspicion for systemic infection as patient is nontoxic-appearing, vital signs  reassuring, no obvious source infection noted on exam.  I have low suspicion for liver or gallbladder abnormality as she has no right upper quadrant tenderness, liver enzymes, alk phos, T bili all within normal limits.   Doubt bowel obstruction, volvulus, diverticulitis, intra-abdominal mass/infection CT imaging negative these findings.  I doubt UTI Pilo kidney stone UA is any sign infection.      Dispostion and problem list  After consideration of the diagnostic results and the patients response to treatment, I feel that the patent would benefit from admission.  Acute pancreatitis-unclear etiology, patient benefit from further workup to determine origin of pancreatitis, likely outpatient follow-up.            Final Clinical Impression(s) / ED Diagnoses Final diagnoses:  Acute pancreatitis without infection or necrosis, unspecified pancreatitis type    Rx / DC Orders ED Discharge Orders     None         Carroll Sage, PA-C 11/07/22 0030    Carroll Sage, PA-C 11/07/22 Westley Hummer, April, MD 11/07/22 0106

## 2022-11-07 NOTE — H&P (Signed)
History and Physical    Jordan Lara:[id number] DOB: 01-13-1981 DOA: 11/06/2022  PCP: Charlane Ferretti, DO  Patient coming from: Home  I have personally briefly reviewed patient's old medical records in [facility]  Chief Complaint: Epigastric pain  HPI: Jordan Lara is a 42 y.o. female with medical history significant for severe persistent asthma, history of coronary artery spasm, chronic recurrent sinusitis who presented to the ED for evaluation of severe epigastric abdominal pain.  Patient states she developed sudden onset severe epigastric abdominal pain around 6:30 PM on 8/15.  This was shortly after dinner.  She had developed associated nausea but did not vomit.  Pain was 8-9/10 in severity.  She called 911 and EMS brought her to the ED.  She was given fentanyl en route with improvement in her abdominal pain.  Patient states she has had two similar but milder episodes remotely, first about 10 years ago and the second a few years ago.  Patient states that she was previously on Dupixent for her asthma/allergies but this was discontinued in April due to concern about side effects potentially causing cutaneous non-Hodgkin's lymphoma.  She also reports a history of aspirin exacerbated respiratory disease and does not take NSAIDs.  She has not had any other significant medication changes.  She reports occasional alcohol use on the weekends, last drink was about 5 days ago.  ED Course  Labs/Imaging on admission: I have personally reviewed following labs and imaging studies.  Initial vitals showed BP 120/78, pulse 99, RR 16, temp 98.3 F, SpO2 99% on room air.  Labs show lipase 567, sodium 136, potassium 3.3, bicarb 22, BUN 14, creatinine 0.90, serum glucose 110, AST 55, ALT 34, alk phos 63, total bilirubin 0.3, WBC 8.0, hemoglobin 14.0, platelets 242,000, serum hCG negative.  CT abdomen/pelvis with contrast showed normal-appearing pancreas.  Distended stomach with ingested  debris, bilateral lower lobe bronchial wall thickening, fullness of the renal collecting systems, and moderate amount of retained stool noted.  The hospitalist service was consulted to admit for further evaluation and management.  Review of Systems: All systems reviewed and are negative except as documented in history of present illness above.   Past Medical History:  Diagnosis Date   Acne rosacea    Allergic rhinitis 07/26/2015   Anxiety    Asthma    exercise induced asthma - dx as a child   Coronary artery spasm (HCC) 2018   Documented on cardiac catheterization   Dyspnea    Eczema, allergic 07/26/2015   Heart attack (HCC) 05/2016   mini x 2 tx with amlodipine   Hx of esophageal reflux    Hypertrophy of inferior nasal turbinate    IBS (irritable bowel syndrome)    Influenza A 05/2015   Menstrual migraine    Mixed hyperlipidemia    patient doesn't know anything about this    Nasal sinus polyp    chronic, has had 2 procedures    Osteopenia    Primary insomnia    Seasonal allergies    Spitz nevus of lower leg, left 08/30/2018    Past Surgical History:  Procedure Laterality Date   cardiac cath  06/25/2016   [facility]   CERVICAL POLYPECTOMY     DILATATION & CURETTAGE/HYSTEROSCOPY WITH MYOSURE N/A 05/08/2017   Procedure: DILATATION & CURETTAGE/HYSTEROSCOPY WITH MYOSURE;  Surgeon: Olivia Mackie, MD;  Location: [facility];  Service: Gynecology;  Laterality: N/A;   KNEE ARTHROSCOPY Right    NASAL  ENDOSCOPY     NASAL SINUS SURGERY     x 2   TONSILLECTOMY AND ADENOIDECTOMY     TURBINATE RESECTION     WISDOM TOOTH EXTRACTION      Social History:  reports that she has never smoked. She has never used smokeless tobacco. She reports that she does not currently use alcohol after a past usage of about 3.0 - 4.0 standard drinks of alcohol per week. She reports that she does not use drugs.  Allergies  Allergen Reactions   Ansaid [Flurbiprofen] Anaphylaxis     Swelling, hives, SOB   Aspirin Hives, Shortness Of Breath and Cough   Nsaids Anaphylaxis, Hives and Swelling    ALL NSAIDS   Azithromycin Other (See Comments)    Red splotches on skin   Bee Venom Swelling    As a child   Sulfonamide Derivatives Other (See Comments)    Childhood allergy    Family History  Problem Relation Age of Onset   Hyperlipidemia Mother    Osteoporosis Mother    GER disease Mother    Crohn's disease Father    Kidney failure Father    Rectal cancer Father    Aneurysm Father    Stroke Maternal Grandmother    Migraines Maternal Aunt    Esophageal cancer Neg Hx      Prior to Admission medications   Medication Sig Start Date End Date Taking? Authorizing Provider  albuterol (PROVENTIL HFA;VENTOLIN HFA) 108 (90 Base) MCG/ACT inhaler Inhale 1-2 puffs into the lungs every 6 (six) hours as needed for wheezing or shortness of breath.   Yes [provider]  amLODipine (NORVASC) 2.5 MG tablet Take 2.5 mg by mouth 2 (two) times daily.   Yes [provider]  ASMANEX HFA 100 MCG/ACT AERO Inhale 2 puffs into the lungs 2 (two) times daily.   Yes [provider]  Azelastine HCl 137 MCG/SPRAY SOLN Place 1 spray into both nostrils as needed (allergies). 10/03/20  Yes [provider]  cetirizine (ZYRTEC) 10 MG tablet Take 10 mg by mouth daily as needed for allergies.   Yes [provider]  diazepam (VALIUM) 5 MG tablet Take 2.5-5 mg by mouth every 6 (six) hours as needed for anxiety.   Yes [provider]  diphenhydrAMINE (BENADRYL) 25 MG tablet Take 50 mg by mouth every 6 (six) hours as needed for itching or allergies.    Yes [provider]  Elderberry 575 MG/5ML SYRP 1 teaspoon daily   Yes [provider]  EPINEPHrine 0.3 mg/0.3 mL IJ SOAJ injection Inject 0.3 mg into the muscle once.   Yes [provider]  fluticasone (FLONASE) 50 MCG/ACT nasal spray Place 2 sprays into both nostrils daily as  needed.    Yes [provider]  SYMBICORT 160-4.5 MCG/ACT inhaler Inhale 2 puffs into the lungs in the morning and at bedtime.   Yes [provider]  montelukast (SINGULAIR) 10 MG tablet TAKE 1 TABLET (10 MG TOTAL) BY MOUTH AT BEDTIME. Patient not taking: Reported on 11/07/2022 05/02/16   Coralyn Helling, MD    Physical Exam: Vitals:   11/06/22 2300 11/06/22 2330 11/06/22 2345 11/07/22 0027  BP: 116/71 112/66 107/63   Pulse:  78 75   Resp:   19   Temp:    98.3 F (36.8 C)  TempSrc:    Oral  SpO2:  98% 95%   Weight:      Height:       Constitutional: Resting  in bed, NAD, calm, comfortable Eyes: EOMI, lids and conjunctivae normal ENMT: Mucous membranes are moist. Posterior pharynx clear of any exudate or lesions.Normal dentition.  Neck: normal, supple, no masses. Respiratory: clear to auscultation bilaterally, no wheezing, no crackles. Normal respiratory effort. No accessory muscle use.  Cardiovascular: Regular rate and rhythm, no murmurs / rubs / gallops. No extremity edema. 2+ pedal pulses. Abdomen: no tenderness, no masses palpated.  Musculoskeletal: no clubbing / cyanosis. No joint deformity upper and lower extremities. Good ROM, no contractures. Normal muscle tone.  Skin: no rashes, lesions, ulcers. No induration Neurologic: Sensation intact. Strength 5/5 in all 4.  Psychiatric: Normal judgment and insight. Alert and oriented x 3. Normal mood.   EKG: Personally reviewed. Sinus rhythm, rate 76, no acute ischemic changes.  No prior for comparison.  Assessment/Plan Principal Problem:   Acute pancreatitis Active Problems:   Severe persistent asthma   Hypokalemia   Jordan Lara is a 42 y.o. female with medical history significant for severe persistent asthma, history of coronary artery spasm, chronic recurrent sinusitis who is admitted with acute pancreatitis.  Assessment and Plan: Acute pancreatitis: Symptomology and elevated lipase consistent with acute  pancreatitis.  CT A/P showed normal-appearing pancreas although it may be too early for the imaging to reflect changes.  No obvious inciting etiology at this time. -Keep n.p.o. tonight -Continue IV fluid hydration -IV antiemetics and analgesics as needed -Obtain RUQ ultrasound -Obtain lipid panel  Severe persistent asthma: Stable on admission.  Continued Symbicort and albuterol as needed.  History of coronary artery spasm: Documented by heart cath April 2018.  Follows with [facility] cardiology.  Continue amlodipine.  Hypokalemia: IV supplement ordered.   DVT prophylaxis: enoxaparin (LOVENOX) injection 40 mg Start: 11/07/22 2200 Code Status: Full code Family Communication: Discussed with patient, she has discussed with family Disposition Plan: From home and likely discharge to home pending clinical progress Consults called: None Severity of Illness: The appropriate patient status for this patient is OBSERVATION. Observation status is judged to be reasonable and necessary in order to provide the required intensity of service to ensure the patient's safety. The patient's presenting symptoms, physical exam findings, and initial radiographic and laboratory data in the context of their medical condition is felt to place them at decreased risk for further clinical deterioration. Furthermore, it is anticipated that the patient will be medically stable for discharge from the hospital within 2 midnights of admission.   Darreld Mclean MD [facility]  If 7PM-7AM, please contact night-coverage [email or web address]  11/07/2022, 12:45 AM

## 2022-11-07 NOTE — Consult Note (Addendum)
Consultation Note   Referring Provider:  [facility] PCP: Charlane Ferretti, DO Primary Gastroenterologist: Dr. Stan Head        Reason for Consultation: acute pancreatitis  DOA: 11/06/2022         Hospital Day: 2   ASSESSMENT & PLAN   42 y.o. year old female with PMH of IBS, (eosinophilic) asthma, hx of coronary artery spasm, and chronic recurrent sinusitis.  Acute Pancreatitis with epigastric pain and lipase in the 560s. No evidence of pancreatitis on CT scan. Etiology is unclear. No evidence of gallstones. No significant use of alcohol. Cannot relate to any medications. TRG Labs are normal. See HPI for further details regarding any risk factors. Patient has not had any further abdominal pain. Abd exam is negative. Tolerating CL diet. Pt wants to go home.  -Stop OTC elderberry -Check IGG subclass IV to evaluate for autoimmune pancreatitis. -Trial for full liquid diet now, if tolerates then ok for discharge from GI standpoint. Upon discharge can slowly upgrade to low fat diet. -No alcohol -No NSAIDs. Pt not currently taking. -Treat empirically with Omeprazole 20 mg po daily QAM -Follow-up in clinic -Call office if patient is not steadily improving.  Hypokalemia upon admittance was 3.3 and today is 4. -resolved  Hepatic steatosis patient has been on long term steroid therapy for asthma. She is actively participating in [place] class three times a week and follows a Mediterranean diet.  -Educated on alcohol cessation -educated on weight loss -We can discuss further at follow-up appt.  HISTORY OF PRESENT ILLNESS   Patient presented to ER last night with main complaint of severe epigastric pain after eating her dinner around 630 pm. The pain was almost immediate described as sharp and radiating to her back. She also had nausea, no vomiting. No fever or chills. No diarrhea.Occasional dysphagia noted with eating bread. Pt received one  dose of pain medication on way to hospital. She has not required anymore pain medication during hospital stay and reports pain level 1/10. Pt tolerated clear liquid diet this morning.     Abd U/S negative for cholelithiasis or acute cholecystitis. Social drinker, drinks 1-2 drinks a month. No reported hx of elevated TRG. LFT's normal. Pt was on Dupixent for asthma, dc'd in April due to rash and concerns for CTCL (cutaneous T-cell lymphoma). No hx of autoimmune disease in patient. Family HX of Crohn's disease and colon CA in her father. Mother has hx of hypercholesterolemia.Most likely due to pancreatitis but due to no risk factors and normal imaging we will treat preventively with PPI therapy.  Previous GI Evaluations   Abd U/S: IMPRESSION: 1. No cholelithiasis or acute cholecystitis. 2. Hepatic steatosis.  CT scan Abd/pelvis: IMPRESSION: 1. Normal pancreas. 2. Distended stomach with ingested debris. 3. Bilateral lower lobe bronchial wall thickening with possible mucous plugging. 4. Fullness of the renal collecting systems bilaterally with no evidence of ureteral calculus. Findings may be secondary to extrinsic compression from enlarged fibroid uterus. 5. Moderate amount of retained stool in the ascending and transverse colon, possible constipation.  Colonoscopy UTD 1/23 due to [facility] colon CA    Labs and Imaging: Recent Labs    11/06/22 2009 11/07/22 0504  WBC 8.0 5.4  HGB 14.0 13.1  HCT 42.3 40.7  PLT 242 229   Recent Labs    11/06/22 2009 11/07/22 0504  NA 136 137  K 3.3* 4.0  CL 105 105  CO2 22 23  GLUCOSE 110* 94  BUN 14 10  CREATININE 0.90 0.86  CALCIUM 8.9 9.1   Recent Labs    11/07/22 0504  PROT 5.9*  ALBUMIN 3.4*  AST 32  ALT 32  ALKPHOS 61  BILITOT 0.2*   No results for input(s): "HEPBSAG", "HCVAB", "HEPAIGM", "HEPBIGM" in the last 72 hours. No results for input(s): "LABPROT", "INR" in the last 72 hours.    Past Medical History:  Diagnosis Date    Acne rosacea    Allergic rhinitis 07/26/2015   Anxiety    Asthma    exercise induced asthma - dx as a child   Coronary artery spasm (HCC) 2018   Documented on cardiac catheterization   Dyspnea    Eczema, allergic 07/26/2015   Heart attack (HCC) 05/2016   mini x 2 tx with amlodipine   Hx of esophageal reflux    Hypertrophy of inferior nasal turbinate    IBS (irritable bowel syndrome)    Influenza A 05/2015   Menstrual migraine    Mixed hyperlipidemia    patient doesn't know anything about this    Nasal sinus polyp    chronic, has had 2 procedures    Osteopenia    Primary insomnia    Seasonal allergies    Spitz nevus of lower leg, left 08/30/2018    Past Surgical History:  Procedure Laterality Date   cardiac cath  06/25/2016   [facility]   CERVICAL POLYPECTOMY     DILATATION & CURETTAGE/HYSTEROSCOPY WITH MYOSURE N/A 05/08/2017   Procedure: DILATATION & CURETTAGE/HYSTEROSCOPY WITH MYOSURE;  Surgeon: Olivia Mackie, MD;  Location: [facility];  Service: Gynecology;  Laterality: N/A;   KNEE ARTHROSCOPY Right    NASAL ENDOSCOPY     NASAL SINUS SURGERY     x 2   TONSILLECTOMY AND ADENOIDECTOMY     TURBINATE RESECTION     WISDOM TOOTH EXTRACTION      Family History  Problem Relation Age of Onset   Hyperlipidemia Mother    Osteoporosis Mother    GER disease Mother    Crohn's disease Father    Kidney failure Father    Rectal cancer Father    Aneurysm Father    Stroke Maternal Grandmother    Migraines Maternal Aunt    Esophageal cancer Neg Hx     Prior to Admission medications   Medication Sig Start Date End Date Taking? Authorizing Provider  albuterol (PROVENTIL HFA;VENTOLIN HFA) 108 (90 Base) MCG/ACT inhaler Inhale 1-2 puffs into the lungs every 6 (six) hours as needed for wheezing or shortness of breath.   Yes [provider]  amLODipine (NORVASC) 2.5 MG tablet Take 2.5 mg by mouth 2 (two) times daily.   Yes [provider]   ASMANEX HFA 100 MCG/ACT AERO Inhale 2 puffs into the lungs 2 (two) times daily.   Yes [provider]  Azelastine HCl 137 MCG/SPRAY SOLN Place 1 spray into both nostrils as needed (allergies). 10/03/20  Yes [provider]  cetirizine (ZYRTEC) 10 MG tablet Take 10 mg by mouth daily as needed for allergies.   Yes [provider]  diazepam (VALIUM) 5 MG tablet Take 2.5-5 mg by mouth every 6 (six) hours as needed for anxiety.   Yes [provider]  diphenhydrAMINE (BENADRYL)  25 MG tablet Take 50 mg by mouth every 6 (six) hours as needed for itching or allergies.    Yes [provider]  Elderberry 575 MG/5ML SYRP 1 teaspoon daily   Yes [provider]  EPINEPHrine 0.3 mg/0.3 mL IJ SOAJ injection Inject 0.3 mg into the muscle once.   Yes [provider]  fluticasone (FLONASE) 50 MCG/ACT nasal spray Place 2 sprays into both nostrils daily as needed.    Yes [provider]  SYMBICORT 160-4.5 MCG/ACT inhaler Inhale 2 puffs into the lungs in the morning and at bedtime.   Yes [provider]  montelukast (SINGULAIR) 10 MG tablet TAKE 1 TABLET (10 MG TOTAL) BY MOUTH AT BEDTIME. Patient not taking: Reported on 11/07/2022 05/02/16   Coralyn Helling, MD    Current Facility-Administered Medications  Medication Dose Route Frequency Provider Last Rate Last Admin   albuterol (PROVENTIL) (2.5 MG/3ML) 0.083% nebulizer solution 2.5 mg  2.5 mg Nebulization Q6H PRN Charlsie Quest, MD       amLODipine (NORVASC) tablet 2.5 mg  2.5 mg Oral BID Charlsie Quest, MD       azelastine (ASTELIN) 0.1 % nasal spray 1 spray  1 spray Each Nare Daily PRN Charlsie Quest, MD       bisacodyl (DULCOLAX) EC tablet 10 mg  10 mg Oral Daily Alwyn Ren, MD       diazepam (VALIUM) tablet 2.5-5 mg  2.5-5 mg Oral Q6H PRN Charlsie Quest, MD       enoxaparin (LOVENOX) injection 40 mg  40 mg Subcutaneous Q24H Darreld Mclean R, MD       famotidine (PEPCID)  tablet 20 mg  20 mg Oral Daily Alwyn Ren, MD   20 mg at 11/07/22 4540   lactated ringers infusion   Intravenous Continuous Charlsie Quest, MD 100 mL/hr at 11/07/22 0202 New Bag at 11/07/22 0202   mometasone-formoterol (DULERA) 200-5 MCG/ACT inhaler 2 puff  2 puff Inhalation BID Charlsie Quest, MD   2 puff at 11/07/22 0745   morphine (PF) 2 MG/ML injection 1 mg  1 mg Intravenous Q3H PRN Charlsie Quest, MD       ondansetron (ZOFRAN) tablet 4 mg  4 mg Oral Q6H PRN Charlsie Quest, MD       Or   ondansetron [facility]) injection 4 mg  4 mg Intravenous Q6H PRN Charlsie Quest, MD        Allergies as of 11/06/2022 - Review Complete 11/06/2022  Allergen Reaction Noted   Ansaid [flurbiprofen] Anaphylaxis 08/30/2018   Aspirin Hives, Shortness Of Breath, and Cough 03/18/2016   Nsaids Anaphylaxis, Hives, and Swelling 03/13/2021   Azithromycin Other (See Comments) 07/26/2015   Bee venom Swelling 08/30/2018   Sulfonamide derivatives Other (See Comments) 08/18/2007    Social History   Socioeconomic History   Marital status: Married    Spouse name: Not on file   Number of children: 0   Years of education: Not on file   Highest education level: Not on file  Occupational History   Occupation: estate Airline pilot   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 1 green tea daily. One alcoholic beverage daily.      Never smoker   EtoH3-4/week   No drugs       Left handed   Caffeine: 1 cup tea/day   Social Determinants of Health   Financial Resource Strain: Not on file  Food Insecurity: No Food Insecurity (11/07/2022)   Hunger Vital Sign    Worried About Running Out of Food in the Last Year: Never true    Ran Out of Food in the Last Year: Never true  Transportation Needs: No Transportation Needs (11/07/2022)   PRAPARE - Administrator, Civil Service (Medical): No    Lack of Transportation (Non-Medical): No  Physical Activity: Sufficiently Active (02/15/2018)   Received from [facility] visits prior to 05/24/2022., [facility] prior to 05/24/2022.   Exercise Vital Sign    Days of Exercise per Week: 4 days    Minutes of Exercise per Session: 40 min  Stress: Not on file  Social Connections: Not on file  Intimate Partner Violence: Not At Risk (11/07/2022)   Humiliation, Afraid, Rape, and Kick questionnaire    Fear of Current or Ex-Partner: No    Emotionally Abused: No    Physically Abused: No    Sexually Abused: No     Code Status   Code Status: Full Code  Review of Systems: All systems reviewed and negative except where noted in HPI.  Physical Exam: Vital signs in last 24 hours: Temp:  [97.5 F (36.4 C)-98.3 F (36.8 C)] 97.5 F (36.4 C) (08/16 0953) Pulse Rate:  [67-99] 79 (08/16 0953) Resp:  [16-20] 16 (08/16 0953) BP: (107-120)/(63-83) 112/70 (08/16 0953) SpO2:  [95 %-99 %] 97 % (08/16 0953) Weight:  [59 kg-61.7 kg] 61.7 kg (08/16 0135) Last BM Date : 11/06/22  General:  Pleasant female in NAD Psych:  Cooperative. Normal mood and affect Eyes: Pupils equal Ears:  Normal auditory acuity Nose: No deformity, discharge or lesions Neck:  Supple, no masses felt Lungs:  Clear to auscultation.  Heart:  Regular rate, regular rhythm.  Abdomen:  Soft, nondistended, nontender, active bowel sounds, no masses felt Rectal :  Deferred Msk: Symmetrical without gross  deformities.  Neurologic:  Alert, oriented, grossly normal neurologically Extremities : No edema Skin:  Intact without significant lesions.    Intake/Output from previous day: No intake/output data recorded. Intake/Output this shift:  No intake/output data recorded.  Principal Problem:   Acute pancreatitis Active Problems:   Severe persistent asthma   Hypokalemia   Deanna May, NP-C @  11/07/2022, 12:16 PM    Attending physician's note   I have taken history, reviewed the chart and examined the patient. I performed a substantive portion of this encounter, including complete performance of at least one of the key components, in conjunction with the APP. I agree with the Advanced Practitioner's note, impression and recommendations.   Acute pancreatitis (biochemical criteria), neg CT AP, [place].  No definite etiology- No alcohol, gallstones, abn LFTs, trauma, FH, cocaine use, use of meds assoc with pancreatitis, hypercalcemia, intake of any herbal meds or previous pancreatitis.  Could be viral.  Hepatic steatosis on CT with  nl LFTs  Plan: -Advance diet.  If tolerates, D/C home -Trial of PPIs in case of any ulcers. -Check IgG4, TG as outpt -FU as outpt.  Gunnar Fusi has set her up for FU in GI clinic as outpt. -Stop elderberry or any dietary supplements for now -Pl call if any ? -D/W pt and family    Edman Circle, MD [facility] [phone]

## 2022-11-07 NOTE — Plan of Care (Signed)
  Problem: Education: Goal: Knowledge of General Education information will improve Description: Including pain rating scale, medication(s)/side effects and non-pharmacologic comfort measures Outcome: Progressing   Problem: Clinical Measurements: Goal: Ability to maintain clinical measurements within normal limits will improve Outcome: Progressing Goal: Will remain free from infection Outcome: Progressing   Problem: Nutrition: Goal: Adequate nutrition will be maintained Outcome: Progressing   Problem: Pain Managment: Goal: General experience of comfort will improve Outcome: Progressing   Problem: Safety: Goal: Ability to remain free from injury will improve Outcome: Progressing

## 2022-11-07 NOTE — ED Notes (Signed)
ED TO INPATIENT HANDOFF REPORT  ED Nurse Name and Phone #:  Deirdre Peer Name/Age/Gender Jordan Lara 42 y.o. female Room/Bed: WA12/WA12  Code Status   Code Status: Full Code  Home/SNF/Other Home Patient oriented to: self, place, time, and situation Is this baseline? Yes   Triage Complete: Triage complete  Chief Complaint Acute pancreatitis [K85.90]  Triage Note Patient presents from home due to upper abdominal pain for the past hour. Roney Jaffe was somewhat sudden in onset and radiates to the chest and back. Patient complains of nausea. EMS administered 100 mcg of fentanyl.    EMS vitals:  146/84 BP 98 HR 0 RR 99 % SPO2 on room air    Allergies Allergies  Allergen Reactions   Ansaid [Flurbiprofen] Anaphylaxis    Swelling, hives, SOB   Aspirin Hives, Shortness Of Breath and Cough   Nsaids Anaphylaxis, Hives and Swelling    ALL NSAIDS   Azithromycin Other (See Comments)    Red splotches on skin   Bee Venom Swelling    As a child   Sulfonamide Derivatives Other (See Comments)    Childhood allergy    Level of Care/Admitting Diagnosis ED Disposition     ED Disposition  Admit   Condition  --   Comment  Hospital Area: [facility] [[id number]]  Level of Care: Med-Surg [16]  May place patient in observation at [facility] or [facility] if equivalent level of care is available:: No  Covid Evaluation: Asymptomatic - no recent exposure (last 10 days) testing not required  Diagnosis: Acute pancreatitis [577.0.ICD-9-CM]  Admitting Physician: Charlsie Quest [[id number]]  Attending Physician: Charlsie Quest [[id number]]          B Medical/Surgery History Past Medical History:  Diagnosis Date   Acne rosacea    Allergic rhinitis 07/26/2015   Anxiety    Asthma    exercise induced asthma - dx as a child   Coronary artery spasm (HCC) 2018   Documented on cardiac catheterization   Dyspnea    Eczema, allergic 07/26/2015   Heart attack (HCC) 05/2016    mini x 2 tx with amlodipine   Hx of esophageal reflux    Hypertrophy of inferior nasal turbinate    IBS (irritable bowel syndrome)    Influenza A 05/2015   Menstrual migraine    Mixed hyperlipidemia    patient doesn't know anything about this    Nasal sinus polyp    chronic, has had 2 procedures    Osteopenia    Primary insomnia    Seasonal allergies    Spitz nevus of lower leg, left 08/30/2018   Past Surgical History:  Procedure Laterality Date   cardiac cath  06/25/2016   [facility]   CERVICAL POLYPECTOMY     DILATATION & CURETTAGE/HYSTEROSCOPY WITH MYOSURE N/A 05/08/2017   Procedure: DILATATION & CURETTAGE/HYSTEROSCOPY WITH MYOSURE;  Surgeon: Olivia Mackie, MD;  Location: [facility];  Service: Gynecology;  Laterality: N/A;   KNEE ARTHROSCOPY Right    NASAL ENDOSCOPY     NASAL SINUS SURGERY     x 2   TONSILLECTOMY AND ADENOIDECTOMY     TURBINATE RESECTION     WISDOM TOOTH EXTRACTION       A IV Location/Drains/Wounds Patient Lines/Drains/Airways Status     Active Line/Drains/Airways     Name Placement date Placement time Site Days   Peripheral IV 11/06/22 18 G Left Antecubital 11/06/22  1943  Antecubital  1  Incision (Closed) 05/08/17 Vagina 05/08/17  1035  -- 2009            Intake/Output Last 24 hours No intake or output data in the 24 hours ending 11/07/22 0038  Labs/Imaging Results for orders placed or performed during the hospital encounter of 11/06/22 (from the past 48 hour(s))  Urinalysis, Routine w reflex microscopic -Urine, Clean Catch     Status: Abnormal   Collection Time: 11/06/22  8:07 PM  Result Value Ref Range   Color, Urine YELLOW YELLOW   APPearance CLEAR CLEAR   Specific Gravity, Urine 1.015 1.005 - 1.030   pH 6.0 5.0 - 8.0   Glucose, UA NEGATIVE NEGATIVE mg/dL   Hgb urine dipstick MODERATE (A) NEGATIVE   Bilirubin Urine NEGATIVE NEGATIVE   Ketones, ur NEGATIVE NEGATIVE mg/dL   Protein, ur NEGATIVE NEGATIVE  mg/dL   Nitrite NEGATIVE NEGATIVE   Leukocytes,Ua NEGATIVE NEGATIVE   RBC / HPF 11-20 0 - 5 RBC/hpf   WBC, UA 0-5 0 - 5 WBC/hpf   Bacteria, UA NONE SEEN NONE SEEN   Squamous Epithelial / HPF 0-5 0 - 5 /HPF   Mucus PRESENT     Comment: Performed at [facility], [address], [place], [place] [address]  Lipase, blood     Status: Abnormal   Collection Time: 11/06/22  8:09 PM  Result Value Ref Range   Lipase 567 (H) 11 - 51 U/L    Comment: RESULT CONFIRMED BY MANUAL DILUTION Performed at [facility], [address], [place], [place] [address]   Comprehensive metabolic panel     Status: Abnormal   Collection Time: 11/06/22  8:09 PM  Result Value Ref Range   Sodium 136 135 - 145 mmol/L   Potassium 3.3 (L) 3.5 - 5.1 mmol/L   Chloride 105 98 - 111 mmol/L   CO2 22 22 - 32 mmol/L   Glucose, Bld 110 (H) 70 - 99 mg/dL    Comment: Glucose reference range applies only to samples taken after fasting for at least 8 hours.   BUN 14 6 - 20 mg/dL   Creatinine, Ser 8.41 0.44 - 1.00 mg/dL   Calcium 8.9 8.9 - 32.4 mg/dL   Total Protein 6.8 6.5 - 8.1 g/dL   Albumin 3.8 3.5 - 5.0 g/dL   AST 55 (H) 15 - 41 U/L   ALT 34 0 - 44 U/L   Alkaline Phosphatase 63 38 - 126 U/L   Total Bilirubin 0.3 0.3 - 1.2 mg/dL   GFR, Estimated >40 >10 mL/min    Comment: (NOTE) Calculated using the CKD-EPI Creatinine Equation (2021)    Anion gap 9 5 - 15    Comment: Performed at [facility], [address] [place], [place] [address]  hCG, serum, qualitative     Status: None   Collection Time: 11/06/22  8:09 PM  Result Value Ref Range   Preg, Serum NEGATIVE NEGATIVE    Comment:        THE SENSITIVITY OF THIS METHODOLOGY IS >10 mIU/mL. Performed at [facility] : [facility], [address] [place], [place] [address]   CBC with Differential     Status: Abnormal   Collection Time: 11/06/22  8:09 PM  Result Value Ref Range   WBC 8.0 4.0 - 10.5 K/uL    RBC 4.85 3.87 - 5.11 MIL/uL   Hemoglobin 14.0 12.0 - 15.0 g/dL   HCT 34.7 42.5 - 95.6 %   MCV 87.2  80.0 - 100.0 fL   MCH 28.9 26.0 - 34.0 pg   MCHC 33.1 30.0 - 36.0 g/dL   RDW 60.4 54.0 - 98.1 %   Platelets 242 150 - 400 K/uL   nRBC 0.0 0.0 - 0.2 %   Neutrophils Relative % 48 %   Neutro Abs 3.9 1.7 - 7.7 K/uL   Lymphocytes Relative 34 %   Lymphs Abs 2.7 0.7 - 4.0 K/uL   Monocytes Relative 8 %   Monocytes Absolute 0.6 0.1 - 1.0 K/uL   Eosinophils Relative 9 %   Eosinophils Absolute 0.7 (H) 0.0 - 0.5 K/uL   Basophils Relative 1 %   Basophils Absolute 0.1 0.0 - 0.1 K/uL   Immature Granulocytes 0 %   Abs Immature Granulocytes 0.01 0.00 - 0.07 K/uL    Comment: Performed at [facility], [address] [place], [place] [address]   CT ABDOMEN PELVIS W CONTRAST  Result Date: 11/06/2022 CLINICAL DATA:  Pancreatitis, acute, severe. Abdominal pain radiating to chest and back. Nausea. EXAM: CT ABDOMEN AND PELVIS WITH CONTRAST TECHNIQUE: Multidetector CT imaging of the abdomen and pelvis was performed using the standard protocol following bolus administration of intravenous contrast. RADIATION DOSE REDUCTION: This exam was performed according to the departmental dose-optimization program which includes automated exposure control, adjustment of the mA and/or kV according to patient size and/or use of iterative reconstruction technique. CONTRAST:  OMNIPAQUE IOHEXOL 300 MG/ML  SOLN COMPARISON:  None Available. FINDINGS: Lower chest: Bronchial wall thickening with possible mucous plugging is noted in the lower lobes bilaterally. Hepatobiliary: No focal liver abnormality is seen. No gallstones, gallbladder wall thickening, or biliary dilatation. Pancreas: Unremarkable. No pancreatic ductal dilatation or surrounding inflammatory changes. Spleen: Normal size. Subcentimeter hypodensities are present in the spleen which are too small to further characterize. Adrenals/Urinary Tract: No  adrenal nodule or mass. The kidneys enhance symmetrically. No renal calculus bilaterally there is mild prominence of the renal pelvis and ureters bilaterally without evidence of obstructing calculus and may be secondary to extrinsic compression from enlarged uterus. The bladder is unremarkable. Stomach/Bowel: Distended stomach with ingested debris. Appendix appears normal. No evidence of bowel wall thickening, distention, or inflammatory changes. No free air or pneumatosis. A moderate amount of retained stool is present in the transverse colon and ascending colon. Vascular/Lymphatic: No significant vascular findings are present. No enlarged abdominal or pelvic lymph nodes. Reproductive: The uterus is enlarged with multiple fibroids. No adnexal mass. Other: A trace amount of free fluid is noted in the cul-de-sac and may be physiologic. There is a small fat containing umbilical hernia. Musculoskeletal: Mild degenerative changes in the lumbar spine. No acute osseous abnormality. IMPRESSION: 1. Normal pancreas. 2. Distended stomach with ingested debris. 3. Bilateral lower lobe bronchial wall thickening with possible mucous plugging. 4. Fullness of the renal collecting systems bilaterally with no evidence of ureteral calculus. Findings may be secondary to extrinsic compression from enlarged fibroid uterus. 5. Moderate amount of retained stool in the ascending and transverse colon, possible constipation. Electronically Signed   By: Thornell Sartorius M.D.   On: 11/06/2022 22:48    Pending Labs Unresulted Labs (From admission, onward)     Start     Ordered   11/07/22 0500  HIV Antibody (routine testing w rflx)  (HIV Antibody (Routine testing w reflex) panel)  Tomorrow morning,   R        11/07/22 0029   11/07/22 0500  Lipid panel  Tomorrow morning,   R  11/07/22 0029   11/07/22 0500  Comprehensive metabolic panel  Tomorrow morning,   R        11/07/22 0029   11/07/22 0500  CBC  Tomorrow morning,   R         11/07/22 0029            Vitals/Pain Today's Vitals   11/06/22 2300 11/06/22 2330 11/06/22 2345 11/07/22 0027  BP: 116/71 112/66 107/63   Pulse:  78 75   Resp:   19   Temp:    98.3 F (36.8 C)  TempSrc:    Oral  SpO2:  98% 95%   Weight:      Height:      PainSc:        Isolation Precautions No active isolations  Medications Medications  enoxaparin (LOVENOX) injection 40 mg (has no administration in time range)  lactated ringers infusion (has no administration in time range)  morphine (PF) 2 MG/ML injection 1 mg (has no administration in time range)  ondansetron (ZOFRAN) tablet 4 mg (has no administration in time range)    Or  ondansetron (ZOFRAN) injection 4 mg (has no administration in time range)  amLODipine (NORVASC) tablet 2.5 mg (has no administration in time range)  azelastine (ASTELIN) 0.1 % nasal spray 1 spray (has no administration in time range)  diazepam (VALIUM) tablet 2.5-5 mg (has no administration in time range)  mometasone-formoterol (DULERA) 200-5 MCG/ACT inhaler 2 puff (has no administration in time range)  potassium chloride 10 mEq in 100 mL IVPB (has no administration in time range)  albuterol (PROVENTIL) (2.5 MG/3ML) 0.083% nebulizer solution 2.5 mg (has no administration in time range)  iohexol (OMNIPAQUE) 300 MG/ML solution 100 mL (100 mLs Intravenous Contrast Given 11/06/22 2206)    Mobility walks     Focused Assessments     R Recommendations: See Admitting Provider Note  Report given to:   Additional Notes: NPO

## 2022-11-07 NOTE — Plan of Care (Signed)

## 2022-11-07 NOTE — Hospital Course (Signed)
Jordan Lara is a 42 y.o. female with medical history significant for severe persistent asthma, history of coronary artery spasm, chronic recurrent sinusitis who is admitted with acute pancreatitis.

## 2022-11-10 LAB — IGG 4: IgG, Subclass 4: 40 mg/dL (ref 2–96)

## 2022-11-11 ENCOUNTER — Encounter: Payer: Self-pay | Admitting: Internal Medicine

## 2022-11-11 DIAGNOSIS — J4550 Severe persistent asthma, uncomplicated: Secondary | ICD-10-CM | POA: Diagnosis not present

## 2022-11-17 DIAGNOSIS — L986 Other infiltrative disorders of the skin and subcutaneous tissue: Secondary | ICD-10-CM | POA: Diagnosis not present

## 2022-11-17 DIAGNOSIS — D485 Neoplasm of uncertain behavior of skin: Secondary | ICD-10-CM | POA: Diagnosis not present

## 2022-11-20 DIAGNOSIS — J4550 Severe persistent asthma, uncomplicated: Secondary | ICD-10-CM | POA: Diagnosis not present

## 2022-11-21 DIAGNOSIS — J029 Acute pharyngitis, unspecified: Secondary | ICD-10-CM | POA: Diagnosis not present

## 2022-11-21 DIAGNOSIS — R059 Cough, unspecified: Secondary | ICD-10-CM | POA: Diagnosis not present

## 2022-11-21 DIAGNOSIS — K859 Acute pancreatitis without necrosis or infection, unspecified: Secondary | ICD-10-CM | POA: Diagnosis not present

## 2022-11-26 DIAGNOSIS — H65191 Other acute nonsuppurative otitis media, right ear: Secondary | ICD-10-CM | POA: Diagnosis not present

## 2022-11-26 DIAGNOSIS — J324 Chronic pansinusitis: Secondary | ICD-10-CM | POA: Diagnosis not present

## 2022-11-27 DIAGNOSIS — Z01411 Encounter for gynecological examination (general) (routine) with abnormal findings: Secondary | ICD-10-CM | POA: Diagnosis not present

## 2022-11-27 DIAGNOSIS — Z01419 Encounter for gynecological examination (general) (routine) without abnormal findings: Secondary | ICD-10-CM | POA: Diagnosis not present

## 2022-11-27 DIAGNOSIS — Z1231 Encounter for screening mammogram for malignant neoplasm of breast: Secondary | ICD-10-CM | POA: Diagnosis not present

## 2022-11-27 DIAGNOSIS — Z124 Encounter for screening for malignant neoplasm of cervix: Secondary | ICD-10-CM | POA: Diagnosis not present

## 2022-12-08 DIAGNOSIS — J329 Chronic sinusitis, unspecified: Secondary | ICD-10-CM | POA: Diagnosis not present

## 2022-12-08 DIAGNOSIS — J324 Chronic pansinusitis: Secondary | ICD-10-CM | POA: Diagnosis not present

## 2022-12-08 DIAGNOSIS — J339 Nasal polyp, unspecified: Secondary | ICD-10-CM | POA: Diagnosis not present

## 2022-12-09 ENCOUNTER — Encounter: Payer: Self-pay | Admitting: Internal Medicine

## 2022-12-09 ENCOUNTER — Other Ambulatory Visit: Payer: Self-pay

## 2022-12-09 DIAGNOSIS — R1012 Left upper quadrant pain: Secondary | ICD-10-CM

## 2022-12-09 DIAGNOSIS — K859 Acute pancreatitis without necrosis or infection, unspecified: Secondary | ICD-10-CM

## 2022-12-09 NOTE — Telephone Encounter (Signed)
Pt made aware of Dr. Leone Payor recommendations: Orders for labs placed in Epic. Pt made aware. Location to lab provided. Pt verbalized understanding with all questions answered.

## 2022-12-09 NOTE — Telephone Encounter (Signed)
Pt was contacted in regard to recent My Chart message that was sent.  Pt stated that she has been pain free for a couple of weeks until a few days ago. Pt stated that she has been feeling a very light aching on the left upper and left lower abdomen. Pt stated that she rated that pain 1 out of 10 on the pain scale. Pt stated that with her history she wanted to make our office aware. Please review and advise

## 2022-12-10 ENCOUNTER — Other Ambulatory Visit (INDEPENDENT_AMBULATORY_CARE_PROVIDER_SITE_OTHER): Payer: BC Managed Care – PPO

## 2022-12-10 DIAGNOSIS — K859 Acute pancreatitis without necrosis or infection, unspecified: Secondary | ICD-10-CM | POA: Diagnosis not present

## 2022-12-10 DIAGNOSIS — R1012 Left upper quadrant pain: Secondary | ICD-10-CM

## 2022-12-10 LAB — COMPREHENSIVE METABOLIC PANEL WITH GFR
ALT: 13 U/L (ref 0–35)
AST: 13 U/L (ref 0–37)
Albumin: 4 g/dL (ref 3.5–5.2)
Alkaline Phosphatase: 55 U/L (ref 39–117)
BUN: 14 mg/dL (ref 6–23)
CO2: 25 meq/L (ref 19–32)
Calcium: 8.9 mg/dL (ref 8.4–10.5)
Chloride: 105 meq/L (ref 96–112)
Creatinine, Ser: 0.95 mg/dL (ref 0.40–1.20)
GFR: 74.41 mL/min (ref >60.00)
Glucose, Bld: 87 mg/dL (ref 70–99)
Potassium: 4 meq/L (ref 3.5–5.1)
Sodium: 138 meq/L (ref 135–145)
Total Bilirubin: 0.4 mg/dL (ref 0.2–1.2)
Total Protein: 6.5 g/dL (ref 6.0–8.3)

## 2022-12-10 LAB — CBC WITH DIFFERENTIAL/PLATELET
Basophils Absolute: 0 10*3/uL (ref 0.0–0.1)
Basophils Relative: 0.1 % (ref 0.0–3.0)
Eosinophils Absolute: 0 10*3/uL (ref 0.0–0.7)
Eosinophils Relative: 0 % (ref 0.0–5.0)
HCT: 41.8 % (ref 36.0–46.0)
Hemoglobin: 13.8 g/dL (ref 12.0–15.0)
Lymphocytes Relative: 17.2 % (ref 12.0–46.0)
Lymphs Abs: 1.1 10*3/uL (ref 0.7–4.0)
MCHC: 33 g/dL (ref 30.0–36.0)
MCV: 86.4 fl (ref 78.0–100.0)
Monocytes Absolute: 0.4 10*3/uL (ref 0.1–1.0)
Monocytes Relative: 6.3 % (ref 3.0–12.0)
Neutro Abs: 4.8 10*3/uL (ref 1.4–7.7)
Neutrophils Relative %: 76.4 % (ref 43.0–77.0)
Platelets: 248 10*3/uL (ref 150.0–400.0)
RBC: 4.83 Mil/uL (ref 3.87–5.11)
RDW: 13.7 % (ref 11.5–15.5)
WBC: 6.3 10*3/uL (ref 4.0–10.5)

## 2022-12-10 LAB — LIPASE: Lipase: 10 U/L — ABNORMAL LOW (ref 11.0–59.0)

## 2022-12-10 LAB — AMYLASE: Amylase: 28 U/L (ref 27–131)

## 2022-12-16 DIAGNOSIS — J4550 Severe persistent asthma, uncomplicated: Secondary | ICD-10-CM | POA: Diagnosis not present

## 2023-01-07 DIAGNOSIS — E785 Hyperlipidemia, unspecified: Secondary | ICD-10-CM | POA: Diagnosis not present

## 2023-01-07 DIAGNOSIS — Z1389 Encounter for screening for other disorder: Secondary | ICD-10-CM | POA: Diagnosis not present

## 2023-01-07 DIAGNOSIS — M8580 Other specified disorders of bone density and structure, unspecified site: Secondary | ICD-10-CM | POA: Diagnosis not present

## 2023-01-07 DIAGNOSIS — J4550 Severe persistent asthma, uncomplicated: Secondary | ICD-10-CM | POA: Diagnosis not present

## 2023-01-14 ENCOUNTER — Ambulatory Visit: Payer: BC Managed Care – PPO | Admitting: Internal Medicine

## 2023-01-14 DIAGNOSIS — Z23 Encounter for immunization: Secondary | ICD-10-CM | POA: Diagnosis not present

## 2023-01-14 DIAGNOSIS — Z1389 Encounter for screening for other disorder: Secondary | ICD-10-CM | POA: Diagnosis not present

## 2023-01-14 DIAGNOSIS — Z1331 Encounter for screening for depression: Secondary | ICD-10-CM | POA: Diagnosis not present

## 2023-01-14 DIAGNOSIS — J4550 Severe persistent asthma, uncomplicated: Secondary | ICD-10-CM | POA: Diagnosis not present

## 2023-01-14 DIAGNOSIS — Z0000 Encounter for general adult medical examination without abnormal findings: Secondary | ICD-10-CM | POA: Diagnosis not present

## 2023-03-09 DIAGNOSIS — J329 Chronic sinusitis, unspecified: Secondary | ICD-10-CM | POA: Diagnosis not present

## 2023-03-09 DIAGNOSIS — J339 Nasal polyp, unspecified: Secondary | ICD-10-CM | POA: Diagnosis not present

## 2023-03-09 DIAGNOSIS — J324 Chronic pansinusitis: Secondary | ICD-10-CM | POA: Diagnosis not present

## 2023-03-09 DIAGNOSIS — Z789 Other specified health status: Secondary | ICD-10-CM | POA: Diagnosis not present

## 2023-04-02 DIAGNOSIS — T50905A Adverse effect of unspecified drugs, medicaments and biological substances, initial encounter: Secondary | ICD-10-CM | POA: Diagnosis not present

## 2023-04-02 DIAGNOSIS — J328 Other chronic sinusitis: Secondary | ICD-10-CM | POA: Diagnosis not present

## 2023-04-02 DIAGNOSIS — J339 Nasal polyp, unspecified: Secondary | ICD-10-CM | POA: Diagnosis not present

## 2023-04-02 DIAGNOSIS — J4550 Severe persistent asthma, uncomplicated: Secondary | ICD-10-CM | POA: Diagnosis not present

## 2023-04-08 ENCOUNTER — Encounter: Payer: Self-pay | Admitting: Internal Medicine

## 2023-04-08 ENCOUNTER — Ambulatory Visit: Payer: BC Managed Care – PPO | Admitting: Internal Medicine

## 2023-04-08 VITALS — BP 100/70 | HR 76 | Ht 59.0 in | Wt 129.4 lb

## 2023-04-08 DIAGNOSIS — K8500 Idiopathic acute pancreatitis without necrosis or infection: Secondary | ICD-10-CM | POA: Diagnosis not present

## 2023-04-08 DIAGNOSIS — F1090 Alcohol use, unspecified, uncomplicated: Secondary | ICD-10-CM

## 2023-04-08 DIAGNOSIS — Z800 Family history of malignant neoplasm of digestive organs: Secondary | ICD-10-CM

## 2023-04-08 DIAGNOSIS — K580 Irritable bowel syndrome with diarrhea: Secondary | ICD-10-CM

## 2023-04-08 DIAGNOSIS — K760 Fatty (change of) liver, not elsewhere classified: Secondary | ICD-10-CM

## 2023-04-08 DIAGNOSIS — K429 Umbilical hernia without obstruction or gangrene: Secondary | ICD-10-CM | POA: Diagnosis not present

## 2023-04-08 NOTE — Patient Instructions (Signed)
 Glad you are better.  _______________________________________________________  If your blood pressure at your visit was 140/90 or greater, please contact your primary care physician to follow up on this.  _______________________________________________________  If you are age 42 or older, your body mass index should be between 23-30. Your Body mass index is 26.13 kg/m. If this is out of the aforementioned range listed, please consider follow up with your Primary Care Provider.  If you are age 91 or younger, your body mass index should be between 19-25. Your Body mass index is 26.13 kg/m. If this is out of the aformentioned range listed, please consider follow up with your Primary Care Provider.   ________________________________________________________  The [facility] providers would like to encourage you to use MYCHART to communicate with providers for non-urgent requests or questions.  Due to long hold times on the telephone, sending your provider a message by [facility] may be a faster and more efficient way to get a response.  Please allow 48 business hours for a response.  Please remember that this is for non-urgent requests.  _______________________________________________________  I appreciate the opportunity to care for you. Loy Ruff, MD, [facility]

## 2023-04-08 NOTE — Progress Notes (Addendum)
 Jordan Lara 43 y.o. May 05, 1980 [id number]  Assessment & Plan:   Encounter Diagnoses  Name Primary?   Idiopathic acute pancreatitis without infection or necrosis Yes   Umbilical hernia without obstruction and without gangrene    Hepatic steatosis    Irritable bowel syndrome with diarrhea    As best we can tell she had an acute episode of pancreatitis.  There are other causes of elevated lipase but the clinical scenario with sudden onset of pain and her other symptoms is consistent with pancreatitis.  Cross-sectional imaging with CT and ultrasound did not reveal problems and serologic workup was otherwise negative.  She will monitor things.  She has a tiny umbilical hernia that I do not think is a problem.  She will monitor that.   We discussed hepatic steatosis, she does not really fit the typical body habitus profile or lifestyle factors that would cause this.  She had reduced alcohol  prior to the pancreatitis but now is having 3 or 4 cocktails a week and is advised to greatly reduce that.  No alcohol  is best in the setting of fatty liver disease.  I do not think alcohol  is driving it necessarily.  Transaminases were quite low.  She can have follow-up LFTs through primary care is monitoring of other potential metabolic issues such as diabetes or cardiovascular disease.  It could be her systemic steroid use in the past that has led to fatty liver.  She manages her IBS-D on her own.  If this deteriorates we could consider further workup.  She is due for a routine screening colonoscopy in 2028 because of her father's history of rectal cancer (in the setting of Crohn's disease).   CC: Windell Hasty, DO  Subjective:   Chief Complaint: Follow-up after pancreatitis  HPI 43 year old woman with a history of IBS, eosinophilic asthma, coronary artery spasm, recurrent sinusitis as well as a family history of Crohn's disease with rectal cancer in father who was admitted to the hospital  11/06/2022 with a lipase 567 and a diagnosis of pancreatitis, mild elevation of AST, normal IgG4.  Cause of the pancreatitis was not clear.  No significant alcohol  use 1 or 2 drinks a month at time of admission.  No history of elevated triglycerides.  Repeat lipase and amylase December 10, 2022 normal.  The patient also reports a history of irregular bowel movements, which she has been managing with a probiotic and prebiotic regimen. She describes her intestines as feeling swollen and painful at times, occurring approximately once or twice a month. She also reports a sensation of incomplete bowel movements, with stools that are too small and soft. Despite these symptoms, the patient manages her condition and is not overly concerned as long as there is no risk of cancer.  In addition to these symptoms, the patient has an umbilical hernia, which has been causing intermittent pain for several years. However, upon examination, it was determined to be a minor issue and not a cause for concern.  The patient maintains a healthy lifestyle, exercising five to six times a week and maintaining a diet free of sugary beverages and high carbohydrate foods. Despite this, she has been diagnosed with fatty liver, a condition she finds concerning given her healthy lifestyle.  She does have a reported history of systemic steroid use.  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  Wt Readings  from Last 3 Encounters:  04/08/23 129 lb 6 oz (58.7 kg)  11/07/22 136 lb 0.4 oz (61.7 kg)  06/26/21 131 lb (59.4 kg)    Right upper quadrant ultrasound 11/07/2022 IMPRESSION: 1. No cholelithiasis or acute cholecystitis. 2. Hepatic steatosis.  CT abdomen and pelvis with contrast 11/06/2022 IMPRESSION: 1. Normal pancreas. 2. Distended stomach with ingested debris. 3. Bilateral lower lobe bronchial  wall thickening with possible mucous plugging. 4. Fullness of the renal collecting systems bilaterally with no evidence of ureteral calculus. Findings may be secondary to extrinsic compression from enlarged fibroid uterus. 5. Moderate amount of retained stool in the ascending and transverse colon, possible constipation.  It also reported a small fat-containing umbilical hernia   Colonoscopy 04/09/2021-normal   Data reviewed as above.  Also reviewed primary care office visit note from 11/11/2022. Allergies  Allergen Reactions   Ansaid [Flurbiprofen] Anaphylaxis    Swelling, hives, SOB   Aspirin Hives, Shortness Of Breath and Cough   Dupilumab Rash    Whole body eczema   Fluticasone-Salmeterol Palpitations and Shortness Of Breath   Nsaids Anaphylaxis, Hives and Swelling    ALL NSAIDS   Azithromycin Other (See Comments)    Red splotches on skin   Bee Venom Swelling    As a child   Sulfonamide Derivatives Other (See Comments)    Childhood allergy   Current Meds  Medication Sig   acetaminophen  (TYLENOL ) 500 MG tablet Take 500 mg by mouth as needed.   albuterol  (PROVENTIL  HFA;VENTOLIN  HFA) 108 (90 Base) MCG/ACT inhaler Inhale 1-2 puffs into the lungs every 6 (six) hours as needed for wheezing or shortness of breath.   amLODipine  (NORVASC ) 2.5 MG tablet Take 2.5 mg by mouth 2 (two) times daily.   augmented betamethasone  dipropionate (DIPROLENE -AF) 0.05 % cream Apply 1 Application topically as needed.   Azelastine  HCl 137 MCG/SPRAY SOLN Place 1 spray into both nostrils as needed (allergies).   B Complex Vitamins (VITAMIN B-COMPLEX) TABS Take 1 tablet by mouth as needed.   budesonide (EQ BUDESONIDE NASAL) 32 MCG/ACT nasal spray Place into both nostrils as needed.   CALCIUM PO Take 1 tablet by mouth as needed.   cetirizine (ZYRTEC) 10 MG tablet Take 10 mg by mouth daily as needed for allergies.   Cholecalciferol (VITAMIN D3) 50 MCG (2000 UT) capsule Take 2,000 Units by mouth daily.    ciprofloxacin-dexamethasone  (CIPRODEX) OTIC suspension Place 4 drops into both ears as needed.   diazepam  (VALIUM ) 5 MG tablet Take 2.5-5 mg by mouth every 6 (six) hours as needed for anxiety.   diphenhydrAMINE (BENADRYL) 25 MG tablet Take 50 mg by mouth every 6 (six) hours as needed for itching or allergies.    ELDERBERRY PO Take 1 Dose by mouth daily.   EPINEPHrine 0.3 mg/0.3 mL IJ SOAJ injection Inject 0.3 mg into the muscle once.   fluticasone (FLONASE) 50 MCG/ACT nasal spray Place 2 sprays into both nostrils daily as needed.    nitroGLYCERIN (NITROSTAT) 0.4 MG SL tablet Place 0.4 mg under the tongue every 5 (five) minutes as needed.   ofloxacin (FLOXIN) 0.3 % OTIC solution as needed.   SYMBICORT 160-4.5 MCG/ACT inhaler Inhale 2 puffs into the lungs in the morning and at bedtime.   TEZSPIRE  210 MG/1. SOAJ Inject 1 Dose into the skin every 28 (twenty-eight) days.   Past Medical History:  Diagnosis Date   Acne rosacea    Allergic rhinitis 07/26/2015   Anxiety    Asthma    exercise induced  asthma - dx as a child   Coronary artery spasm (HCC) 2018   Documented on cardiac catheterization   Dyspnea    Eczema, allergic 07/26/2015   GERD (gastroesophageal reflux disease)    Heart attack (HCC) 05/2016   mini x 2 tx with amlodipine    Hepatic steatosis    incidentally found on imaging 11/07/2022   Hx of esophageal reflux    Hypertrophy of inferior nasal turbinate    IBS (irritable bowel syndrome)    Influenza A 05/2015   Menstrual migraine    Mixed hyperlipidemia    patient doesn't know anything about this    Nasal sinus polyp    chronic, has had 2 procedures    Osteopenia    Pancreatitis    Primary insomnia    Seasonal allergies    Spitz nevus of lower leg, left 08/30/2018   Past Surgical History:  Procedure Laterality Date   cardiac cath  06/25/2016   [facility]   CERVICAL POLYPECTOMY     DILATATION & CURETTAGE/HYSTEROSCOPY WITH MYOSURE N/A  05/08/2017   Procedure: DILATATION & CURETTAGE/HYSTEROSCOPY WITH MYOSURE;  Surgeon: Meriam Stamp, MD;  Location: [facility];  Service: Gynecology;  Laterality: N/A;   KNEE ARTHROSCOPY Right    NASAL ENDOSCOPY     NASAL SINUS SURGERY     x 2   TONSILLECTOMY AND ADENOIDECTOMY     TURBINATE RESECTION     WISDOM TOOTH EXTRACTION     Social History   Social History Narrative   Married no children. 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 1 green tea daily. One alcoholic beverage daily.      Never smoker   EtoH3-4/week   No drugs      Left handed   Caffeine: 1 cup tea/day   family history includes Aneurysm in her father; Crohn's disease in her father; GER disease in her mother; Hyperlipidemia in her mother; Kidney failure in her father; Migraines in her maternal aunt; Osteoporosis in her mother; Rectal cancer in her father; Stroke in her maternal grandmother.   Review of Systems As per HPI  Objective:   Physical Exam BP 100/70 (BP Location: Left Arm, Patient Position: Sitting, Cuff Size: Normal)   Pulse 76   Ht 4\' 11"  (1.499 m)   Wt 129 lb 6 oz (58.7 kg)   LMP 03/25/2023   BMI 26.13 kg/m  NAD Abd soft NT - no visible umbilical hernia but can palpate tiny hernia

## 2023-05-05 DIAGNOSIS — N920 Excessive and frequent menstruation with regular cycle: Secondary | ICD-10-CM | POA: Diagnosis not present

## 2023-05-18 DIAGNOSIS — Z789 Other specified health status: Secondary | ICD-10-CM | POA: Diagnosis not present

## 2023-05-18 DIAGNOSIS — H6521 Chronic serous otitis media, right ear: Secondary | ICD-10-CM | POA: Diagnosis not present

## 2023-05-18 DIAGNOSIS — J324 Chronic pansinusitis: Secondary | ICD-10-CM | POA: Diagnosis not present

## 2023-05-18 DIAGNOSIS — J339 Nasal polyp, unspecified: Secondary | ICD-10-CM | POA: Diagnosis not present

## 2023-05-19 ENCOUNTER — Encounter: Payer: Self-pay | Admitting: Internal Medicine

## 2023-05-20 NOTE — Telephone Encounter (Signed)
 Spoke with pt about recent symptoms. Pt stated that she has been having some nausea after heavy meals along with some right upper abdominal pain. Pt stated that this has happened over the last week and a half about three times after eating a heavy meal. Pt stated that she has no symptoms when eating light meals. Please review and advise. Pt was notified that Dr. Leone Payor is out of the office this week and will return next week. Pt verbalized understanding with all questions answered.

## 2023-05-21 DIAGNOSIS — M25551 Pain in right hip: Secondary | ICD-10-CM | POA: Diagnosis not present

## 2023-05-21 DIAGNOSIS — M25511 Pain in right shoulder: Secondary | ICD-10-CM | POA: Diagnosis not present

## 2023-05-25 DIAGNOSIS — M25511 Pain in right shoulder: Secondary | ICD-10-CM | POA: Diagnosis not present

## 2023-05-25 DIAGNOSIS — M25551 Pain in right hip: Secondary | ICD-10-CM | POA: Diagnosis not present

## 2023-05-28 DIAGNOSIS — M25551 Pain in right hip: Secondary | ICD-10-CM | POA: Diagnosis not present

## 2023-05-28 DIAGNOSIS — M25511 Pain in right shoulder: Secondary | ICD-10-CM | POA: Diagnosis not present

## 2023-06-09 ENCOUNTER — Encounter: Payer: Self-pay | Admitting: Internal Medicine

## 2023-06-16 DIAGNOSIS — M21612 Bunion of left foot: Secondary | ICD-10-CM | POA: Diagnosis not present

## 2023-06-16 DIAGNOSIS — M79672 Pain in left foot: Secondary | ICD-10-CM | POA: Diagnosis not present

## 2023-06-18 ENCOUNTER — Other Ambulatory Visit: Payer: Self-pay | Admitting: Obstetrics and Gynecology

## 2023-06-25 ENCOUNTER — Encounter (HOSPITAL_COMMUNITY): Payer: Self-pay | Admitting: Obstetrics and Gynecology

## 2023-06-25 NOTE — Progress Notes (Signed)
 Spoke w/ via phone for pre-op interview--- Jordan Lara needs dos---- CBC, UPT and T&S. BMP per anesthesia.        Lara results------Current EKG in Epic dated 11/06/22. COVID test -----patient states asymptomatic no test needed Arrive at -------1015 NPO after MN NO Solid Food.  Clear liquids from MN until---0915 Pre-Surgery Ensure or G2:  Med rec completed Medications to take morning of surgery -----Albuterol inhaler PRN, Norvasc, Valium PRN and Symbicort.   Diabetic medication -----  GLP1 agonist last dose: GLP1 instructions:  Patient instructed no nail polish to be worn day of surgery Patient instructed to bring photo id and insurance card day of surgery Patient aware to have Driver (ride ) / caregiver    for 24 hours after surgery - Husband Jordan Lara Patient Special Instructions ----- Shower with antibacterial soap. Pre-Op special Instructions -----  Patient verbalized understanding of instructions that were given at this phone interview. Patient denies chest pain, sob, fever, cough at the interview.

## 2023-07-02 ENCOUNTER — Ambulatory Visit (HOSPITAL_COMMUNITY)
Admission: RE | Admit: 2023-07-02 | Discharge: 2023-07-02 | Disposition: A | Discharge status: Home / Self Care | Source: Ambulatory Visit | Attending: Obstetrics and Gynecology | Admitting: Obstetrics and Gynecology

## 2023-07-02 ENCOUNTER — Encounter (HOSPITAL_COMMUNITY): Payer: Self-pay | Admitting: Obstetrics and Gynecology

## 2023-07-02 ENCOUNTER — Ambulatory Visit (HOSPITAL_COMMUNITY): Payer: Self-pay | Admitting: Anesthesiology

## 2023-07-02 ENCOUNTER — Other Ambulatory Visit: Payer: Self-pay

## 2023-07-02 ENCOUNTER — Encounter (HOSPITAL_COMMUNITY)
Admission: RE | Disposition: A | Discharge status: Home / Self Care | Payer: Self-pay | Source: Ambulatory Visit | Attending: Obstetrics and Gynecology

## 2023-07-02 DIAGNOSIS — N840 Polyp of corpus uteri: Secondary | ICD-10-CM | POA: Insufficient documentation

## 2023-07-02 DIAGNOSIS — Z302 Encounter for sterilization: Secondary | ICD-10-CM | POA: Insufficient documentation

## 2023-07-02 DIAGNOSIS — I252 Old myocardial infarction: Secondary | ICD-10-CM | POA: Insufficient documentation

## 2023-07-02 DIAGNOSIS — N939 Abnormal uterine and vaginal bleeding, unspecified: Secondary | ICD-10-CM | POA: Diagnosis not present

## 2023-07-02 DIAGNOSIS — J45909 Unspecified asthma, uncomplicated: Secondary | ICD-10-CM | POA: Insufficient documentation

## 2023-07-02 DIAGNOSIS — I10 Essential (primary) hypertension: Secondary | ICD-10-CM | POA: Diagnosis not present

## 2023-07-02 DIAGNOSIS — D259 Leiomyoma of uterus, unspecified: Secondary | ICD-10-CM | POA: Diagnosis not present

## 2023-07-02 DIAGNOSIS — K219 Gastro-esophageal reflux disease without esophagitis: Secondary | ICD-10-CM | POA: Diagnosis not present

## 2023-07-02 DIAGNOSIS — N920 Excessive and frequent menstruation with regular cycle: Secondary | ICD-10-CM | POA: Insufficient documentation

## 2023-07-02 HISTORY — DX: Nausea with vomiting, unspecified: R11.2

## 2023-07-02 HISTORY — PX: LAPAROSCOPIC BILATERAL SALPINGECTOMY: SHX5889

## 2023-07-02 HISTORY — DX: Nausea with vomiting, unspecified: Z98.890

## 2023-07-02 HISTORY — DX: Essential (primary) hypertension: I10

## 2023-07-02 HISTORY — PX: HYSTEROSCOPY: SHX211

## 2023-07-02 LAB — BASIC METABOLIC PANEL WITH GFR
Anion gap: 10 (ref 5–15)
BUN: 14 mg/dL (ref 6–20)
CO2: 18 mmol/L — ABNORMAL LOW (ref 22–32)
Calcium: 8.1 mg/dL — ABNORMAL LOW (ref 8.9–10.3)
Chloride: 108 mmol/L (ref 98–111)
Creatinine, Ser: 0.72 mg/dL (ref 0.44–1.00)
GFR, Estimated: >60 mL/min (ref >60)
Glucose, Bld: 81 mg/dL (ref 70–99)
Potassium: 3.8 mmol/L (ref 3.5–5.1)
Sodium: 136 mmol/L (ref 135–145)

## 2023-07-02 LAB — CBC
HCT: 36.9 % (ref 36.0–46.0)
Hemoglobin: 11.9 g/dL — ABNORMAL LOW (ref 12.0–15.0)
MCH: 27.9 pg (ref 26.0–34.0)
MCHC: 32.2 g/dL (ref 30.0–36.0)
MCV: 86.4 fL (ref 80.0–100.0)
Platelets: 189 10*3/uL (ref 150–400)
RBC: 4.27 MIL/uL (ref 3.87–5.11)
RDW: 14.1 % (ref 11.5–15.5)
WBC: 4.5 10*3/uL (ref 4.0–10.5)
nRBC: 0 % (ref 0.0–0.2)

## 2023-07-02 LAB — TYPE AND SCREEN
ABO/RH(D): A POS
Antibody Screen: NEGATIVE

## 2023-07-02 LAB — ABO/RH: ABO/RH(D): A POS

## 2023-07-02 LAB — POCT PREGNANCY, URINE: Preg Test, Ur: NEGATIVE

## 2023-07-02 SURGERY — ABLATION, ENDOMETRIUM, HYSTEROSCOPIC
Anesthesia: General

## 2023-07-02 MED ORDER — CEFAZOLIN SODIUM-DEXTROSE 2-4 GM/100ML-% IV SOLN
INTRAVENOUS | Status: AC
Start: 2023-07-02 — End: 2023-07-02
  Filled 2023-07-02: qty 100

## 2023-07-02 MED ORDER — PROPOFOL 10 MG/ML IV BOLUS
INTRAVENOUS | Status: DC | PRN
  Administered 2023-07-02: 150 mg via INTRAVENOUS

## 2023-07-02 MED ORDER — LACTATED RINGERS IV SOLN: INTRAVENOUS | Status: DC

## 2023-07-02 MED ORDER — BUPIVACAINE HCL (PF) 0.25 % IJ SOLN
INTRAMUSCULAR | Status: DC | PRN
  Administered 2023-07-02: 30 mL

## 2023-07-02 MED ORDER — DEXAMETHASONE SODIUM PHOSPHATE 10 MG/ML IJ SOLN
INTRAMUSCULAR | Status: AC
  Filled 2023-07-02: qty 1

## 2023-07-02 MED ORDER — PROPOFOL 10 MG/ML IV BOLUS
INTRAVENOUS | Status: AC
  Filled 2023-07-02: qty 20

## 2023-07-02 MED ORDER — CHLORHEXIDINE GLUCONATE 0.12 % MT SOLN
15.0000 mL | Freq: Once | OROMUCOSAL | Status: AC
  Administered 2023-07-02: 15 mL via OROMUCOSAL

## 2023-07-02 MED ORDER — PHENYLEPHRINE 80 MCG/ML (10ML) SYRINGE FOR IV PUSH (FOR BLOOD PRESSURE SUPPORT)
PREFILLED_SYRINGE | INTRAVENOUS | Status: DC | PRN
  Administered 2023-07-02: 80 ug via INTRAVENOUS
  Administered 2023-07-02: 160 ug via INTRAVENOUS

## 2023-07-02 MED ORDER — OXYCODONE HCL 5 MG PO TABS
5.0000 mg | ORAL_TABLET | Freq: Four times a day (QID) | ORAL | 0 refills | Status: AC | PRN
Start: 2023-07-02 — End: ?

## 2023-07-02 MED ORDER — ONDANSETRON HCL 4 MG/2ML IJ SOLN: 4.0000 mg | Freq: Once | INTRAMUSCULAR | Status: DC | PRN

## 2023-07-02 MED ORDER — LIDOCAINE 2% (20 MG/ML) 5 ML SYRINGE
INTRAMUSCULAR | Status: AC
  Filled 2023-07-02: qty 5

## 2023-07-02 MED ORDER — ACETAMINOPHEN 500 MG PO TABS
ORAL_TABLET | ORAL | Status: AC
  Filled 2023-07-02: qty 2

## 2023-07-02 MED ORDER — POVIDONE-IODINE 10 % EX SWAB: 2.0000 | Freq: Once | CUTANEOUS | Status: DC

## 2023-07-02 MED ORDER — FENTANYL CITRATE (PF) 250 MCG/5ML IJ SOLN
INTRAMUSCULAR | Status: AC
  Filled 2023-07-02: qty 5

## 2023-07-02 MED ORDER — SCOPOLAMINE 1 MG/3DAYS TD PT72
1.0000 | MEDICATED_PATCH | TRANSDERMAL | Status: DC
  Administered 2023-07-02: 1.5 mg via TRANSDERMAL

## 2023-07-02 MED ORDER — ONDANSETRON HCL 4 MG/2ML IJ SOLN
INTRAMUSCULAR | Status: AC
  Filled 2023-07-02: qty 2

## 2023-07-02 MED ORDER — SODIUM CHLORIDE 0.9 % IR SOLN
Status: DC | PRN
  Administered 2023-07-02: 3000 mL

## 2023-07-02 MED ORDER — MIDAZOLAM HCL 2 MG/2ML IJ SOLN
INTRAMUSCULAR | Status: AC
  Filled 2023-07-02: qty 2

## 2023-07-02 MED ORDER — SCOPOLAMINE 1 MG/3DAYS TD PT72
MEDICATED_PATCH | TRANSDERMAL | Status: AC
  Filled 2023-07-02: qty 1

## 2023-07-02 MED ORDER — SUGAMMADEX SODIUM 200 MG/2ML IV SOLN
INTRAVENOUS | Status: DC | PRN
Start: 2023-07-02 — End: 2023-07-02
  Administered 2023-07-02 (×2): 100 mg via INTRAVENOUS

## 2023-07-02 MED ORDER — BUPIVACAINE HCL (PF) 0.25 % IJ SOLN
INTRAMUSCULAR | Status: AC
  Filled 2023-07-02: qty 30

## 2023-07-02 MED ORDER — ACETAMINOPHEN 500 MG PO TABS
1000.0000 mg | ORAL_TABLET | Freq: Once | ORAL | Status: AC
  Administered 2023-07-02: 1000 mg via ORAL

## 2023-07-02 MED ORDER — FENTANYL CITRATE (PF) 100 MCG/2ML IJ SOLN
25.0000 ug | INTRAMUSCULAR | Status: DC | PRN
  Administered 2023-07-02: 50 ug via INTRAVENOUS
  Administered 2023-07-02: 25 ug via INTRAVENOUS

## 2023-07-02 MED ORDER — ROCURONIUM BROMIDE 10 MG/ML (PF) SYRINGE
PREFILLED_SYRINGE | INTRAVENOUS | Status: AC
  Filled 2023-07-02: qty 10

## 2023-07-02 MED ORDER — ORAL CARE MOUTH RINSE: 15.0000 mL | Freq: Once | OROMUCOSAL | Status: AC

## 2023-07-02 MED ORDER — CHLORHEXIDINE GLUCONATE 0.12 % MT SOLN
OROMUCOSAL | Status: AC
  Filled 2023-07-02: qty 15

## 2023-07-02 MED ORDER — LIDOCAINE 2% (20 MG/ML) 5 ML SYRINGE
INTRAMUSCULAR | Status: DC | PRN
  Administered 2023-07-02: 100 mg via INTRAVENOUS

## 2023-07-02 MED ORDER — CEFAZOLIN SODIUM-DEXTROSE 2-4 GM/100ML-% IV SOLN
2.0000 g | INTRAVENOUS | Status: AC
  Administered 2023-07-02: 2 g via INTRAVENOUS

## 2023-07-02 MED ORDER — ONDANSETRON HCL 4 MG/2ML IJ SOLN
INTRAMUSCULAR | Status: DC | PRN
  Administered 2023-07-02: 4 mg via INTRAVENOUS

## 2023-07-02 MED ORDER — ROCURONIUM BROMIDE 10 MG/ML (PF) SYRINGE
PREFILLED_SYRINGE | INTRAVENOUS | Status: DC | PRN
  Administered 2023-07-02: 20 mg via INTRAVENOUS
  Administered 2023-07-02: 30 mg via INTRAVENOUS

## 2023-07-02 MED ORDER — OXYCODONE HCL 5 MG PO TABS: 5.0000 mg | ORAL_TABLET | Freq: Once | ORAL | Status: DC | PRN

## 2023-07-02 MED ORDER — FENTANYL CITRATE (PF) 100 MCG/2ML IJ SOLN
INTRAMUSCULAR | Status: AC
  Filled 2023-07-02: qty 2

## 2023-07-02 MED ORDER — DEXAMETHASONE SODIUM PHOSPHATE 10 MG/ML IJ SOLN
INTRAMUSCULAR | Status: DC | PRN
Start: 2023-07-02 — End: 2023-07-02
  Administered 2023-07-02: 10 mg via INTRAVENOUS

## 2023-07-02 MED ORDER — FENTANYL CITRATE (PF) 250 MCG/5ML IJ SOLN
INTRAMUSCULAR | Status: DC | PRN
  Administered 2023-07-02: 100 ug via INTRAVENOUS
  Administered 2023-07-02: 50 ug via INTRAVENOUS

## 2023-07-02 MED ORDER — MIDAZOLAM HCL 2 MG/2ML IJ SOLN
INTRAMUSCULAR | Status: DC | PRN
  Administered 2023-07-02: 2 mg via INTRAVENOUS

## 2023-07-02 MED ORDER — MIDAZOLAM HCL 2 MG/2ML IJ SOLN
1.0000 mg | Freq: Once | INTRAMUSCULAR | Status: AC | PRN
  Administered 2023-07-02: 1 mg via INTRAVENOUS

## 2023-07-02 MED ORDER — MEPERIDINE HCL 25 MG/ML IJ SOLN: 6.2500 mg | INTRAMUSCULAR | Status: DC | PRN

## 2023-07-02 MED ORDER — OXYCODONE HCL 5 MG/5ML PO SOLN: 5.0000 mg | Freq: Once | ORAL | Status: DC | PRN

## 2023-07-02 MED ORDER — VASOPRESSIN 20 UNIT/ML IV SOLN
INTRAVENOUS | Status: DC | PRN
  Administered 2023-07-02: 18 mL

## 2023-07-02 MED ORDER — PHENYLEPHRINE 80 MCG/ML (10ML) SYRINGE FOR IV PUSH (FOR BLOOD PRESSURE SUPPORT)
PREFILLED_SYRINGE | INTRAVENOUS | Status: AC
  Filled 2023-07-02: qty 10

## 2023-07-02 SURGICAL SUPPLY — 40 items
ABLATOR SURESOUND NOVASURE (ABLATOR) IMPLANT
CABLE HIGH FREQUENCY MONO STRZ (ELECTRODE) IMPLANT
CATH ROBINSON RED A/P 16FR (CATHETERS) IMPLANT
DERMABOND ADVANCED .7 DNX12 (GAUZE/BANDAGES/DRESSINGS) ×2 IMPLANT
DEVICE MYOSURE LITE (MISCELLANEOUS) IMPLANT
DRAPE SURG IRRIG POUCH 19X23 (DRAPES) ×2 IMPLANT
DRSG OPSITE POSTOP 3X4 (GAUZE/BANDAGES/DRESSINGS) IMPLANT
FORCEPS CUTTING 33CM 5MM (CUTTING FORCEPS) IMPLANT
FORCEPS CUTTING 45CM 5MM (CUTTING FORCEPS) IMPLANT
GLOVE BIO SURGEON STRL SZ7.5 (GLOVE) ×2 IMPLANT
GLOVE BIOGEL PI IND STRL 7.0 (GLOVE) ×2 IMPLANT
GLOVE SURG UNDER POLY LF SZ7 (GLOVE) ×2 IMPLANT
IRRIG SUCT STRYKERFLOW 2 WTIP (MISCELLANEOUS) IMPLANT
IRRIGATION SUCT STRKRFLW 2 WTP (MISCELLANEOUS) IMPLANT
KIT PINK PAD W/HEAD ARE REST (MISCELLANEOUS) ×2 IMPLANT
KIT PINK PAD W/HEAD ARM REST (MISCELLANEOUS) ×2 IMPLANT
KIT PROCEDURE FLUENT (KITS) IMPLANT
KIT TURNOVER KIT B (KITS) ×2 IMPLANT
LIGASURE VESSEL 5MM BLUNT TIP (ELECTROSURGICAL) IMPLANT
NDL INSUFFLATION 14GA 120MM (NEEDLE) ×2 IMPLANT
NEEDLE INSUFFLATION 14GA 120MM (NEEDLE) ×2 IMPLANT
NS IRRIG 1000ML POUR BTL (IV SOLUTION) ×2 IMPLANT
PACK LAPAROSCOPY BASIN (CUSTOM PROCEDURE TRAY) ×2 IMPLANT
PACK VAGINAL MINOR WOMEN LF (CUSTOM PROCEDURE TRAY) ×2 IMPLANT
PAD OB MATERNITY 11 LF (PERSONAL CARE ITEMS) ×4 IMPLANT
SEAL ROD LENS SCOPE MYOSURE (ABLATOR) IMPLANT
SET TUBE SMOKE EVAC HIGH FLOW (TUBING) ×2 IMPLANT
SOL .9 NS 3000ML IRR UROMATIC (IV SOLUTION) IMPLANT
SOL ELECTROSURG ANTI STICK (MISCELLANEOUS) IMPLANT
SOLUTION ELECTROSURG ANTI STCK (MISCELLANEOUS) ×2 IMPLANT
SUT VICRYL 0 UR6 27IN ABS (SUTURE) ×2 IMPLANT
SUT VICRYL 4-0 PS2 18IN ABS (SUTURE) ×4 IMPLANT
SYS BAG RETRIEVAL 10MM (BASKET) IMPLANT
SYSTEM BAG RETRIEVAL 10MM (BASKET) IMPLANT
TOWEL GREEN STERILE FF (TOWEL DISPOSABLE) ×4 IMPLANT
TRAY FOLEY W/BAG SLVR 14FR (SET/KITS/TRAYS/PACK) ×2 IMPLANT
TROCAR OPTI TIP 5M 100M (ENDOMECHANICALS) ×2 IMPLANT
TROCAR XCEL DIL TIP R 11M (ENDOMECHANICALS) ×2 IMPLANT
UNDERPAD 30X36 HEAVY ABSORB (UNDERPADS AND DIAPERS) ×2 IMPLANT
WARMER LAPAROSCOPE (MISCELLANEOUS) ×2 IMPLANT

## 2023-07-02 NOTE — Discharge Instructions (Addendum)
  Post Anesthesia Home Care Instructions  Activity: Get plenty of rest for the remainder of the day. A responsible adult should stay with you for 24 hours following the procedure.  For the next 24 hours, DO NOT: -Drive a car -Advertising copywriter -Drink alcoholic beverages -Take any medication unless instructed by your physician -Make any legal decisions or sign important papers.  Meals: Start with liquid foods such as gelatin or soup. Progress to regular foods as tolerated. Avoid greasy, spicy, heavy foods. If nausea and/or vomiting occur, drink only clear liquids until the nausea and/or vomiting subsides. Call your physician if vomiting continues.  Special Instructions/Symptoms: Your throat may feel dry or sore from the anesthesia or the breathing tube placed in your throat during surgery. If this causes discomfort, gargle with warm salt water. The discomfort should disappear within 24 hours.  If you had a scopolamine patch placed behind your ear for the management of post- operative nausea and/or vomiting:  1. The medication in the patch is effective for 72 hours, after which it should be removed.  Wrap patch in a tissue and discard in the trash. Wash hands thoroughly with soap and water. 2. You may remove the patch earlier than 72 hours if you experience unpleasant side effects which may include dry mouth, dizziness or visual disturbances. 3. Avoid touching the patch. Wash your hands with soap and water after contact with the patch.   Post Anesthesia Home Care Instructions  Activity: Get plenty of rest for the remainder of the day. A responsible individual must stay with you for 24 hours following the procedure.  For the next 24 hours, DO NOT: -Drive a car -Advertising copywriter -Drink alcoholic beverages -Take any medication unless instructed by your physician -Make any legal decisions or sign important papers.  Meals: Start with liquid foods such as gelatin or soup. Progress to  regular foods as tolerated. Avoid greasy, spicy, heavy foods. If nausea and/or vomiting occur, drink only clear liquids until the nausea and/or vomiting subsides. Call your physician if vomiting continues.  Special Instructions/Symptoms: Your throat may feel dry or sore from the anesthesia or the breathing tube placed in your throat during surgery. If this causes discomfort, gargle with warm salt water. The discomfort should disappear within 24 hours.  If you had a scopolamine patch placed behind your ear for the management of post- operative nausea and/or vomiting:  1. The medication in the patch is effective for 72 hours, after which it should be removed.  Wrap patch in a tissue and discard in the trash. Wash hands thoroughly with soap and water. 2. You may remove the patch earlier than 72 hours if you experience unpleasant side effects which may include dry mouth, dizziness or visual disturbances. 3. Avoid touching the patch. Wash your hands with soap and water after contact with the patch.

## 2023-07-02 NOTE — Transfer of Care (Signed)
 Immediate Anesthesia Transfer of Care Note  Patient: Jordan Lara  Procedure(s) Performed: ABLATION, ENDOMETRIUM, HYSTEROSCOPIC SALPINGECTOMY, BILATERAL, LAPAROSCOPIC (Bilateral)  Patient Location: PACU  Anesthesia Type:General  Level of Consciousness: awake and alert   Airway & Oxygen Therapy: Patient Spontanous Breathing and Patient connected to face mask oxygen  Post-op Assessment: Report given to RN and Post -op Vital signs reviewed and stable  Post vital signs: Reviewed and stable  Last Vitals:  Vitals Value Taken Time  BP 122/71 07/02/23 1353  Temp    Pulse 78 07/02/23 1400  Resp 16 07/02/23 1400  SpO2 94 % 07/02/23 1400  Vitals shown include unfiled device data.  Last Pain:  Vitals:   07/02/23 1008  TempSrc: Oral  PainSc: 0-No pain      Patients Stated Pain Goal: 5 (07/02/23 1008)  Complications: No notable events documented.

## 2023-07-02 NOTE — H&P (Signed)
 Jordan Lara is an 43 y.o. female with menorrhagia and desire for elective sterilization. Diag HS, EAB. Bilateral salpingectomy  Pertinent Gynecological History: Menses: flow is moderate Bleeding: dysfunctional uterine bleeding Contraception: none DES exposure: denies Blood transfusions: none Sexually transmitted diseases: no past history Previous GYN Procedures:  na   Last mammogram: normal Date: 2024 Last pap: normal Date: 2024 OB History: G0, P0   Menstrual History: Menarche age: 69 Patient's last menstrual period was 06/15/2023 (exact date).    Past Medical History:  Diagnosis Date   Acne rosacea    Allergic rhinitis 07/26/2015   Anxiety    Asthma    exercise induced asthma - dx as a child   Coronary artery spasm (HCC) 2018   Documented on cardiac catheterization   Dyspnea    Eczema, allergic 07/26/2015   GERD (gastroesophageal reflux disease)    Heart attack (HCC) 05/2016   mini x 2 tx with amlodipine   Hepatic steatosis    incidentally found on imaging 11/07/2022   Hx of esophageal reflux    Hypertension    Hypertrophy of inferior nasal turbinate    IBS (irritable bowel syndrome)    Influenza A 05/2015   Menstrual migraine    Mixed hyperlipidemia    patient doesn't know anything about this    Nasal sinus polyp    chronic, has had 2 procedures    Osteopenia    Pancreatitis    PONV (postoperative nausea and vomiting)    Primary insomnia    Seasonal allergies    Spitz nevus of lower leg, left 08/30/2018    Past Surgical History:  Procedure Laterality Date   cardiac cath  06/25/2016   [facility]   CERVICAL POLYPECTOMY     DILATATION & CURETTAGE/HYSTEROSCOPY WITH MYOSURE N/A 05/08/2017   Procedure: DILATATION & CURETTAGE/HYSTEROSCOPY WITH MYOSURE;  Surgeon: Olivia Mackie, MD;  Location: [facility];  Service: Gynecology;  Laterality: N/A;   KNEE ARTHROSCOPY Right    NASAL ENDOSCOPY     NASAL SINUS SURGERY     x 2   TONSILLECTOMY  AND ADENOIDECTOMY     TURBINATE RESECTION     WISDOM TOOTH EXTRACTION      Family History  Problem Relation Age of Onset   Hyperlipidemia Mother    Osteoporosis Mother    GER disease Mother    Crohn's disease Father    Kidney failure Father    Rectal cancer Father    Aneurysm Father    Stroke Maternal Grandmother    Migraines Maternal Aunt    Esophageal cancer Neg Hx     Social History:  reports that she has never smoked. She has never used smokeless tobacco. She reports that she does not currently use alcohol after a past usage of about 3.0 - 4.0 standard drinks of alcohol per week. She reports that she does not use drugs.  Allergies:  Allergies  Allergen Reactions   Ansaid [Flurbiprofen] Anaphylaxis    Swelling, hives, SOB   Aspirin Hives, Shortness Of Breath and Cough   Dupilumab Rash    Whole body   Fluticasone-Salmeterol Palpitations and Shortness Of Breath   Nsaids Anaphylaxis, Hives and Swelling    ALL NSAIDS   Sulfonamide Derivatives Anaphylaxis    Childhood allergy   Azithromycin Other (See Comments)    Red splotches on skin   Bee Venom Swelling    As a child    No medications prior to admission.    Review of  Systems  Constitutional: Negative.   All other systems reviewed and are negative.   Height 4\' 11"  (1.499 m), weight 56.2 kg, last menstrual period 06/15/2023. Physical Exam Constitutional:      Appearance: Normal appearance. She is normal weight.  HENT:     Head: Normocephalic and atraumatic.  Cardiovascular:     Rate and Rhythm: Normal rate and regular rhythm.     Pulses: Normal pulses.     Heart sounds: Normal heart sounds.  Pulmonary:     Effort: Pulmonary effort is normal.     Breath sounds: Normal breath sounds.  Abdominal:     General: Bowel sounds are normal.     Palpations: Abdomen is soft.  Genitourinary:    General: Normal vulva.  Musculoskeletal:        General: Normal range of motion.     Cervical back: Normal range of  motion and neck supple.  Skin:    General: Skin is warm and dry.  Neurological:     General: No focal deficit present.     Mental Status: She is alert and oriented to person, place, and time.  Psychiatric:        Mood and Affect: Mood normal.        Behavior: Behavior normal.     No results found for this or any previous visit (from the past 24 hours).  No results found.  Assessment/Plan: Menorrhagia- known fibroids Elective Salpingectomy Diag HS, D&C, EAB, Diag LS with bilateral salpingectomy Consent done. Surgical risks discussed. Risks of anesthesia, infection, bleeding, injury to surrounding organs with need for repair discussed.  Fionn Stracke J 07/02/2023, 7:09 AM

## 2023-07-02 NOTE — Anesthesia Preprocedure Evaluation (Signed)
 Anesthesia Evaluation  Patient identified by MRN, date of birth, ID band Patient awake    Reviewed: Allergy & Precautions, NPO status , Patient's Chart, lab work & pertinent test results  History of Anesthesia Complications (+) PONV and history of anesthetic complications  Airway Mallampati: II  TM Distance: >3 FB Neck ROM: Full    Dental no notable dental hx. (+) Teeth Intact, Dental Advisory Given   Pulmonary shortness of breath and with exertion, asthma  Exercise induced   Pulmonary exam normal breath sounds clear to auscultation       Cardiovascular hypertension, + Past MI  Normal cardiovascular exam Rhythm:Regular Rate:Normal  Hx/o MI 2 years ago due to asthma attack and coronary vasospasm- on amlodipine now   Neuro/Psych   Anxiety     negative neurological ROS     GI/Hepatic Neg liver ROS,GERD  Medicated and Controlled,,  Endo/Other  negative endocrine ROS    Renal/GU negative Renal ROS  negative genitourinary   Musculoskeletal negative musculoskeletal ROS (+)    Abdominal   Peds  Hematology negative hematology ROS (+)   Anesthesia Other Findings   Reproductive/Obstetrics AUB                             Anesthesia Physical Anesthesia Plan  ASA: 2  Anesthesia Plan: General   Post-op Pain Management: Tylenol PO (pre-op)*   Induction: Intravenous  PONV Risk Score and Plan: 4 or greater and Scopolamine patch - Pre-op, Midazolam, Dexamethasone, Ondansetron and Treatment may vary due to age or medical condition  Airway Management Planned: LMA and Oral ETT  Additional Equipment: None  Intra-op Plan:   Post-operative Plan: Extubation in OR  Informed Consent: I have reviewed the patients History and Physical, chart, labs and discussed the procedure including the risks, benefits and alternatives for the proposed anesthesia with the patient or authorized representative who has  indicated his/her understanding and acceptance.     Dental advisory given  Plan Discussed with: CRNA, Anesthesiologist and Surgeon  Anesthesia Plan Comments: (No Ketorolac)        Anesthesia Quick Evaluation

## 2023-07-02 NOTE — Anesthesia Procedure Notes (Signed)
 Procedure Name: Intubation Date/Time: 07/02/2023 12:30 PM  Performed by: Brittni Hult C, CRNAPre-anesthesia Checklist: Patient identified, Emergency Drugs available, Suction available and Patient being monitored Patient Re-evaluated:Patient Re-evaluated prior to induction Oxygen Delivery Method: Circle system utilized Preoxygenation: Pre-oxygenation with 100% oxygen Induction Type: IV induction Ventilation: Mask ventilation without difficulty Laryngoscope Size: Mac and 3 Grade View: Grade I Tube type: Oral Tube size: 7.0 mm Number of attempts: 1 Airway Equipment and Method: Stylet and Oral airway Placement Confirmation: ETT inserted through vocal cords under direct vision, positive ETCO2 and breath sounds checked- equal and bilateral Secured at: 21 cm Tube secured with: Tape Dental Injury: Teeth and Oropharynx as per pre-operative assessment

## 2023-07-02 NOTE — Op Note (Signed)
 07/02/2023  1:34 PM  PATIENT:  Jordan Lara  43 y.o. female  PRE-OPERATIVE DIAGNOSIS:  Abnormal Uterine Bleeding, Elective Sterilization  POST-OPERATIVE DIAGNOSIS:  Abnormal Uterine Bleeding, Elective Sterilization  PROCEDURE:  Procedure(s): ABLATION, ENDOMETRIUM, HYSTEROSCOPIC MYOSURE RESECTION OF ENDOMETRIAL POLYP DILATATION AND CURETTAGE TOTAL SALPINGECTOMY, BILATERAL, LAPAROSCOPIC  SURGEON:  Surgeon(s): Olivia Mackie, MD  ASSISTANTS: Jenell Milliner, RNFA   ANESTHESIA:   local and general  ESTIMATED BLOOD LOSS: less than 50cc   DRAINS: none   LOCAL MEDICATIONS USED:  MARCAINE    and Amount: 20 ml  SPECIMEN:  Source of Specimen:  Biliateral tubes, EMC , endo polyp  DISPOSITION OF SPECIMEN:  PATHOLOGY  COUNTS:  YES  DICTATION #: [id number]  PLAN OF CARE: dc home  PATIENT DISPOSITION:  PACU - hemodynamically stable.

## 2023-07-03 ENCOUNTER — Encounter (HOSPITAL_COMMUNITY): Payer: Self-pay | Admitting: Obstetrics and Gynecology

## 2023-07-03 LAB — SURGICAL PATHOLOGY

## 2023-07-03 NOTE — Op Note (Signed)
 NAME: Jordan Lara, Jordan Lara MEDICAL RECORD NO: [id number] ACCOUNT NO: [id number] DATE OF BIRTH: Aug 08, 1980 FACILITY: [facility] LOCATION: [facility] PHYSICIAN: Lenoard Aden, MD  Operative Report   DATE OF PROCEDURE: 07/02/2023   PREOPERATIVE DIAGNOSES: 1. Desire for elective sterilization and total salpingectomy. 2. Menorrhagia with desire for surgical treatment. 3. Uterine fibroids.  POSTOPERATIVE DIAGNOSES: 1. Desire for elective sterilization and total salpingectomy. 2. Menorrhagia with desire for surgical treatment. 3. Uterine fibroids.  PROCEDURE: Diagnostic hysteroscopy, D and C, MyoSure resection of endometrial polyp, underwent endometrial ablation, diagnostic laparoscopy, bilateral total salpingectomy.  SURGEON: Lenoard Aden, MD  ASSISTANT: Sid Falcon, RNFA  ANESTHESIA: Local and general.  ESTIMATED BLOOD LOSS: Less than 50 mL.  FLUID DEFICIT: 320 mL.  COMPLICATIONS: None.  DRAINS: None.  COUNTS: Correct.  The patient to recovery in good condition.   SPECIMENS: Bilateral tubes, endometrial curettings, and endometrial polyp. All specimens sent to pathology.  The patient's count correct, the patient transferred to recovery in good condition.  BRIEF OPERATIVE NOTE: After being apprised to the risks of anesthesia, infection, bleeding, injury to surrounding organs, possible need for repair, delayed emergency, immediate complications to include bowel and bladder injury, possible need for repair,  failure of risk of tubal ligation, 5-10 per 1000, the patient was brought to the operating room where she was administered general anesthetic without complications. The feet were placed in the Yellofin stirrups without difficulty. Exam under anesthesia  reveals an 8-week sized uterus. No adnexal masses. At this time, the Rubin's cannula was placed vaginally without difficulty for uterine manipulation and a Foley catheter was placed. Attention was turned to the abdominal portion  of the procedure whereby  an Allis clamp was used to clamp the skin. Veress needle placed. Opening pressure of -1 noted. 3 liters of CO2 insufflated without difficulty. Trocar placed atraumatically. Visualization reveals an anterior displaced uterus with known uterine fibroids,  bilateral tubes, and normal ovaries. Bowel inspection appears to be normal. There is no evidence of bowel or omental perforation. Liver, gallbladder bed appears normal. Splenic flexure appears normal. Diaphragm looks normal. At this time, two 5 mm ports  were made, right and left under direct visualization and a LigaSure device and a locking atraumatic grasper were placed. The right tube was traced to the fimbriated end and using the LigaSure, the entire right tube was removed starting at the fundal  portion all the way to the fimbriated end going through the mesosalpinx with recurrent bites with bipolar cautery and division. Same procedure was done to the left. Both tubes were removed under direct visualization. At this time, minimal bleeding was  noted. CO2 was released. Positive pressure was applied. The incision was closed with #0 Vicryl, 4-0 Vicryl, and Dermabond placed. Attention was turned to the vaginal portion of the procedure whereby the cervix was easily dilated up to a 19 Pratt dilator  after placement of Pitressin in a standard fashion, 18 mL total and a dilute Marcaine block with 20 mL total. Visualization revealed posterior wall endometrial polypoid activity. No evidence of submucous fibroid. Bilateral normal tubal ostia. At this  time MyoSure device was placed and resection of posterior wall endometrial polyp done without difficulty and sent to pathology. D and C was performed using sharp curettage in a 4-quadrant method and the NovaSure device was then placed seated to a length  of 5 and a width of 3.7 initiated for 81 seconds without difficulty after a negative CO2 test. The device was removed at this time,  inspected and found to be intact. Minimal bleeding was noted. All instruments were removed. The patient tolerated the  procedure well, was awakened and transported to the recovery in good condition.     SUJ D: 07/02/2023 1:44:13 pm T: 07/03/2023 12:26:00 am  JOB: [id number]/ [id number]

## 2023-07-05 NOTE — Anesthesia Postprocedure Evaluation (Signed)
 Anesthesia Post Note  Patient: Jordan Lara  Procedure(s) Performed: ABLATION, ENDOMETRIUM, HYSTEROSCOPIC SALPINGECTOMY, BILATERAL, LAPAROSCOPIC (Bilateral)     Patient location during evaluation: PACU Anesthesia Type: General Level of consciousness: awake and alert Pain management: pain level controlled Vital Signs Assessment: post-procedure vital signs reviewed and stable Respiratory status: spontaneous breathing, nonlabored ventilation, respiratory function stable and patient connected to nasal cannula oxygen Cardiovascular status: blood pressure returned to baseline and stable Postop Assessment: no apparent nausea or vomiting Anesthetic complications: no   No notable events documented.  Last Vitals:  Vitals:   07/02/23 1355 07/02/23 1425  BP: 122/71   Pulse: 78 (!) 53  Resp: 11 (!) 23  Temp:    SpO2: 99% 95%    Last Pain:  Vitals:   07/02/23 1458  TempSrc:   PainSc: 4                  Naomii Kreger

## 2023-07-28 ENCOUNTER — Ambulatory Visit: Admitting: Podiatry

## 2023-08-05 DIAGNOSIS — J4550 Severe persistent asthma, uncomplicated: Secondary | ICD-10-CM | POA: Diagnosis not present

## 2023-08-26 DIAGNOSIS — J4540 Moderate persistent asthma, uncomplicated: Secondary | ICD-10-CM | POA: Diagnosis not present

## 2023-09-07 DIAGNOSIS — H93A1 Pulsatile tinnitus, right ear: Secondary | ICD-10-CM | POA: Diagnosis not present

## 2023-09-07 DIAGNOSIS — Z9622 Myringotomy tube(s) status: Secondary | ICD-10-CM | POA: Diagnosis not present

## 2023-10-01 DIAGNOSIS — D225 Melanocytic nevi of trunk: Secondary | ICD-10-CM | POA: Diagnosis not present

## 2023-10-01 DIAGNOSIS — D2272 Melanocytic nevi of left lower limb, including hip: Secondary | ICD-10-CM | POA: Diagnosis not present

## 2023-10-01 DIAGNOSIS — L813 Cafe au lait spots: Secondary | ICD-10-CM | POA: Diagnosis not present

## 2023-10-01 DIAGNOSIS — D2271 Melanocytic nevi of right lower limb, including hip: Secondary | ICD-10-CM | POA: Diagnosis not present

## 2023-10-21 DIAGNOSIS — I201 Angina pectoris with documented spasm: Secondary | ICD-10-CM | POA: Diagnosis not present

## 2023-10-21 DIAGNOSIS — J45909 Unspecified asthma, uncomplicated: Secondary | ICD-10-CM | POA: Diagnosis not present

## 2023-10-21 DIAGNOSIS — I252 Old myocardial infarction: Secondary | ICD-10-CM | POA: Diagnosis not present

## 2023-11-04 DIAGNOSIS — J324 Chronic pansinusitis: Secondary | ICD-10-CM | POA: Diagnosis not present

## 2023-11-04 DIAGNOSIS — Z789 Other specified health status: Secondary | ICD-10-CM | POA: Diagnosis not present

## 2023-11-04 DIAGNOSIS — J339 Nasal polyp, unspecified: Secondary | ICD-10-CM | POA: Diagnosis not present

## 2023-12-03 DIAGNOSIS — Z131 Encounter for screening for diabetes mellitus: Secondary | ICD-10-CM | POA: Diagnosis not present

## 2023-12-03 DIAGNOSIS — Z13228 Encounter for screening for other metabolic disorders: Secondary | ICD-10-CM | POA: Diagnosis not present

## 2023-12-03 DIAGNOSIS — Z124 Encounter for screening for malignant neoplasm of cervix: Secondary | ICD-10-CM | POA: Diagnosis not present

## 2023-12-03 DIAGNOSIS — Z130 Encounter for screening for diseases of the blood and blood-forming organs and certain disorders involving the immune mechanism: Secondary | ICD-10-CM | POA: Diagnosis not present

## 2023-12-03 DIAGNOSIS — Z13220 Encounter for screening for lipoid disorders: Secondary | ICD-10-CM | POA: Diagnosis not present

## 2023-12-03 DIAGNOSIS — Z6826 Body mass index (BMI) 26.0-26.9, adult: Secondary | ICD-10-CM | POA: Diagnosis not present

## 2023-12-03 DIAGNOSIS — Z01419 Encounter for gynecological examination (general) (routine) without abnormal findings: Secondary | ICD-10-CM | POA: Diagnosis not present

## 2023-12-04 DIAGNOSIS — Z713 Dietary counseling and surveillance: Secondary | ICD-10-CM | POA: Diagnosis not present

## 2023-12-04 DIAGNOSIS — Z6826 Body mass index (BMI) 26.0-26.9, adult: Secondary | ICD-10-CM | POA: Diagnosis not present

## 2024-01-14 DIAGNOSIS — M8580 Other specified disorders of bone density and structure, unspecified site: Secondary | ICD-10-CM | POA: Diagnosis not present

## 2024-01-14 DIAGNOSIS — E785 Hyperlipidemia, unspecified: Secondary | ICD-10-CM | POA: Diagnosis not present

## 2024-01-14 DIAGNOSIS — M8589 Other specified disorders of bone density and structure, multiple sites: Secondary | ICD-10-CM | POA: Diagnosis not present

## 2024-01-14 DIAGNOSIS — Z0189 Encounter for other specified special examinations: Secondary | ICD-10-CM | POA: Diagnosis not present

## 2024-01-20 DIAGNOSIS — J4550 Severe persistent asthma, uncomplicated: Secondary | ICD-10-CM | POA: Diagnosis not present

## 2024-01-20 DIAGNOSIS — Z1331 Encounter for screening for depression: Secondary | ICD-10-CM | POA: Diagnosis not present

## 2024-01-20 DIAGNOSIS — Z1389 Encounter for screening for other disorder: Secondary | ICD-10-CM | POA: Diagnosis not present

## 2024-01-20 DIAGNOSIS — Z23 Encounter for immunization: Secondary | ICD-10-CM | POA: Diagnosis not present

## 2024-01-20 DIAGNOSIS — Z0000 Encounter for general adult medical examination without abnormal findings: Secondary | ICD-10-CM | POA: Diagnosis not present

## 2024-01-26 DIAGNOSIS — Z6826 Body mass index (BMI) 26.0-26.9, adult: Secondary | ICD-10-CM | POA: Diagnosis not present

## 2024-01-26 DIAGNOSIS — Z713 Dietary counseling and surveillance: Secondary | ICD-10-CM | POA: Diagnosis not present

## 2024-02-02 DIAGNOSIS — D259 Leiomyoma of uterus, unspecified: Secondary | ICD-10-CM | POA: Diagnosis not present

## 2024-02-04 DIAGNOSIS — Z1231 Encounter for screening mammogram for malignant neoplasm of breast: Secondary | ICD-10-CM | POA: Diagnosis not present

## 2024-06-20 ENCOUNTER — Ambulatory Visit (HOSPITAL_COMMUNITY): Admit: 2024-06-20 | Admitting: Obstetrics and Gynecology

## 2024-06-20 SURGERY — HYSTERECTOMY, VAGINAL, LAPAROSCOPY-ASSISTED
Anesthesia: Choice | Laterality: Bilateral
